# Patient Record
Sex: Male | Born: 1980
Health system: Southern US, Community
[De-identification: ages and names within clinical notes are randomized; demographics above are authoritative.]

## PROBLEM LIST (undated history)

## (undated) DIAGNOSIS — Z789 Other specified health status: Secondary | ICD-10-CM

## (undated) HISTORY — PX: HAND SURGERY: SHX662

## (undated) HISTORY — DX: Other specified health status: Z78.9

---

## 2018-02-19 ENCOUNTER — Telehealth: Payer: Self-pay | Admitting: *Deleted

## 2018-02-19 NOTE — Telephone Encounter (Signed)
Called pt. 2x to sched. and ask questions about becoming new VV pt. 02/19/18 LL

## 2018-04-25 ENCOUNTER — Encounter (HOSPITAL_COMMUNITY): Payer: Self-pay

## 2018-04-25 ENCOUNTER — Ambulatory Visit (INDEPENDENT_AMBULATORY_CARE_PROVIDER_SITE_OTHER): Payer: No Typology Code available for payment source

## 2018-04-25 ENCOUNTER — Ambulatory Visit (HOSPITAL_COMMUNITY)
Admission: EM | Admit: 2018-04-25 | Discharge: 2018-04-25 | Disposition: A | Discharge status: Home / Self Care | Payer: No Typology Code available for payment source | Attending: Family Medicine | Admitting: Family Medicine

## 2018-04-25 DIAGNOSIS — M25471 Effusion, right ankle: Secondary | ICD-10-CM | POA: Diagnosis not present

## 2018-04-25 DIAGNOSIS — M25571 Pain in right ankle and joints of right foot: Secondary | ICD-10-CM

## 2018-04-25 MED ORDER — NAPROXEN 500 MG PO TABS: 500.0000 mg | ORAL_TABLET | Freq: Two times a day (BID) | ORAL | 0 refills | Status: DC

## 2018-04-25 NOTE — Discharge Instructions (Signed)
X-rays did not show fracture or dislocation Continue conservative management of rest, ice, elevation and gentle stretches ASO ankle brace placed Take naproxen as needed for pain relief (may cause abdominal discomfort, ulcers, and GI bleeds avoid taking with other NSAIDs) Follow up with PCP or with orthopedist if symptoms persist Return or go to the ER if you have any new or worsening symptoms (fever, chills, chest pain, abdominal pain, changes in bowel or bladder habits, pain radiating into lower legs, etc...)

## 2018-04-25 NOTE — ED Triage Notes (Signed)
Pt presents with right ankle pain and swelling that has progressively got worse of the pat 2 weeks but is not associated with any injury.

## 2018-04-25 NOTE — ED Notes (Signed)
Patient able to ambulate independently  

## 2018-04-25 NOTE — ED Provider Notes (Signed)
[facility]   [id number] 04/25/18 Arrival Time: 1825  WU:[id number]:JOINT PAIN  SUBJECTIVE: History from: patient. James Ashley is a 37 y.o. male complains of right ankle pain and swelling that began 2 weeks ago.  Denies a precipitating event or specific injury.  Localizes the pain to the outside of ankle.  Describes the pain as intermittent and dull and achy in character.  Has tried OTC medications with temporary relief.  Symptoms are made worse with standing for prolonged periods of time and pointing toes.  Played football in HS and college and "rolled" ankle multiple times.  Denies fever, chills, erythema, ecchymosis, weakness, numbness and tingling.      ROS: As per HPI.  History reviewed. No pertinent past medical history. History reviewed. No pertinent surgical history. No Known Allergies No current facility-administered medications on file prior to encounter.    No current outpatient medications on file prior to encounter.   Social History   Socioeconomic History  . Marital status: Single    Spouse name: Not on file  . Number of children: Not on file  . Years of education: Not on file  . Highest education level: Not on file  Occupational History  . Not on file  Social Needs  . Financial resource strain: Not on file  . Food insecurity:    Worry: Not on file    Inability: Not on file  . Transportation needs:    Medical: Not on file    Non-medical: Not on file  Tobacco Use  . Smoking status: Not on file  Substance and Sexual Activity  . Alcohol use: Not on file  . Drug use: Not on file  . Sexual activity: Not on file  Lifestyle  . Physical activity:    Days per week: Not on file    Minutes per session: Not on file  . Stress: Not on file  Relationships  . Social connections:    Talks on phone: Not on file    Gets together: Not on file    Attends religious service: Not on file    Active member of club or organization: Not on file    Attends meetings of  clubs or organizations: Not on file    Relationship status: Not on file  . Intimate partner violence:    Fear of current or ex partner: Not on file    Emotionally abused: Not on file    Physically abused: Not on file    Forced sexual activity: Not on file  Other Topics Concern  . Not on file  Social History Narrative  . Not on file   History reviewed. No pertinent family history.  OBJECTIVE:  Vitals:   04/25/18 2024  BP: (!) 156/94  Pulse: 80  Resp: 16  Temp: 97.9 F (36.6 C)  TempSrc: Oral  SpO2: 100%    General appearance: AOx3; in no acute distress.  Head: NCAT Lungs: CTA bilaterally Heart: RRR.  Clear S1 and S2 without murmur, gallops, or rubs.  Radial pulses 2+ bilaterally. Musculoskeletal: Right ankle Inspection: Skin warm, dry, clear and intact.  Obvious swelling to the lateral aspect of the ankle Palpation: Diffusely tender over the lateral aspect of the ankle ROM: FROM active and passive Strength: 5/5 knee flexion, 5/5 knee extension, 5/5 dorsiflexion, 5/5 plantar flexion Skin: warm and dry Neurologic: Ambulates without difficulty; Sensation intact about the upper/ lower extremities Psychological: alert and cooperative; normal mood and affect   DIAGNOSTIC STUDIES:  Dg Ankle Complete Right  Result Date: 04/25/2018 CLINICAL DATA:  Right ankle pain and swelling for the past 2 weeks. No known injury. EXAM: RIGHT ANKLE - COMPLETE 3+ VIEW COMPARISON:  None. FINDINGS: Suspected mild diffuse soft tissue swelling about the ankle. No associated fracture or dislocation. Joint spaces are preserved. The ankle mortise is preserved. Potential small ankle joint effusion. Small plantar calcaneal spur. Note is made of a small os peroneus. IMPRESSION: 1. Mild diffuse soft tissue swelling about the ankle and suspected small ankle joint effusion without associated fracture or dislocation. 2. Small plantar calcaneal spur. Electronically Signed   By: Simonne Come M.D.   On: 04/25/2018  19:33   ASSESSMENT & PLAN:  1. Acute right ankle pain   2. Right ankle effusion     Meds ordered this encounter  Medications  . naproxen (NAPROSYN) 500 MG tablet    Sig: Take 1 tablet (500 mg total) by mouth 2 (two) times daily.    Dispense:  30 tablet    Refill:  0    Order Specific Question:   Supervising Provider    Answer:   Eustace Moore [[id number]]   X-rays did not show fracture or dislocation Continue conservative management of rest, ice, elevation and gentle stretches ASO ankle brace placed Take naproxen as needed for pain relief (may cause abdominal discomfort, ulcers, and GI bleeds avoid taking with other NSAIDs) Follow up with PCP or with orthopedist if symptoms persist Return or go to the ER if you have any new or worsening symptoms (fever, chills, chest pain, abdominal pain, changes in bowel or bladder habits, pain radiating into lower legs, etc...)   Reviewed expectations re: course of current medical issues. Questions answered. Outlined signs and symptoms indicating need for more acute intervention. Patient verbalized understanding. After Visit Summary given.    Rennis Harding, PA-C 04/25/18 2114

## 2018-08-31 ENCOUNTER — Encounter: Payer: Self-pay | Admitting: Family Medicine

## 2018-08-31 ENCOUNTER — Other Ambulatory Visit: Payer: Self-pay

## 2018-08-31 ENCOUNTER — Ambulatory Visit (INDEPENDENT_AMBULATORY_CARE_PROVIDER_SITE_OTHER): Payer: No Typology Code available for payment source | Admitting: Family Medicine

## 2018-08-31 DIAGNOSIS — G8929 Other chronic pain: Secondary | ICD-10-CM

## 2018-08-31 DIAGNOSIS — M25571 Pain in right ankle and joints of right foot: Secondary | ICD-10-CM | POA: Diagnosis not present

## 2018-08-31 MED ORDER — MELOXICAM 15 MG PO TABS: 15.0000 mg | ORAL_TABLET | Freq: Every day | ORAL | 0 refills | Status: DC

## 2018-08-31 MED FILL — MELOXICAM 15 MG TABLET: 15 | 30 days supply | Qty: 30 | Fill #0

## 2018-08-31 NOTE — Progress Notes (Addendum)
CC: R ankle pain     New Patient Visit SUBJECTIVE: HPI: James Ashley is an 38 y.o.male who is being seen for establishing care. Due to outbreak, we are interacting via web portal for an electronic face-to-face visit. I verified patient's ID using 2 identifiers.   The patient was previously seen at office that is now out of network.   Hurt ankle 04/2018. No known inj or change in activity. 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 Played college ftball, had ankle issues. Went to [facility], no fx. Placed in brace that did help. Told to f/u with ortho, needs referral per insurance. Still having pain and swelling. No bruising.   No Known Allergies  Past Medical History:  Diagnosis Date  . No known health problems    History reviewed. No pertinent surgical history. Family History  Problem Relation Age of Onset  . Cancer Neg Hx    No Known Allergies  Current Outpatient Medications:  .  meloxicam (MOBIC) 15 MG tablet, Take 1 tablet (15 mg total) by mouth daily., Disp: 30 tablet, Rfl: 0 .  naproxen (NAPROSYN) 500 MG tablet, Take 1 tablet (500 mg total) by mouth 2 (two) times daily., Disp: 30 tablet, Rfl: 0  ROS MSK: +Ankle pain  OBJECTIVE: No conversational dyspnea Age appropriate judgment and insight Nml affect and mood  ASSESSMENT/PLAN: Chronic pain of right ankle - Plan: Ambulatory referral to Orthopedic Surgery, meloxicam (MOBIC) 15 MG tablet  Refer to ortho. Trial Mobic. Ice. Stretches/exercises. Letters given for work.  Patient should return at convenience for CPE. The patient voiced understanding and agreement to the plan.   Jilda Roche [place], DO 08/31/18  4:43 PM

## 2018-08-31 NOTE — Patient Instructions (Signed)
Continue wearing the brace.  Ice/cold pack over area for 10-15 min twice daily.  Heat (pad or rice pillow in microwave) over affected area, 10-15 minutes twice daily.   OK to take Tylenol 1000 mg (2 extra strength tabs) or 975 mg (3 regular strength tabs) every 6 hours as needed.  Ankle Exercises It is normal to feel mild stretching, pulling, tightness, or discomfort as you do these exercises, but you should stop right away if you feel sudden pain or your pain gets worse.  Stretching and range of motion exercises These exercises warm up your muscles and joints and improve the movement and flexibility of your ankle. These exercises also help to relieve pain, numbness, and tingling. Exercise A: Dorsiflexion/Plantar Flexion   1. Sit with your affected knee straight or bent. Do not rest your foot on anything. 2. Flex your affected ankle to tilt the top of your foot toward your shin. 3. Hold this position for 5 seconds. 4. Point your toes downward to tilt the top of your foot away from your shin. 5. Hold this position for 5 seconds. Repeat 2 times. Complete this exercise 3 times per week. Exercise B: Ankle Alphabet   1. Sit with your affected foot supported at your lower leg. ? Do not rest your foot on anything. ? Make sure your foot has room to move freely. 2. Think of your affected foot as a paintbrush, and move your foot to trace each letter of the alphabet in the air. Keep your hip and knee still while you trace. Make the letters as large as you can without increasing any discomfort. 3. Trace every letter from A to Z. Repeat 2 times. Complete this exercise 3 times per week. Strengthening exercises These exercises build strength and endurance in your ankle. Endurance is the ability to use your muscles for a long time, even after they get tired. Exercise D: Dorsiflexors   1. Secure a rubber exercise band or tube to an object, such as a table leg, that will stay still when the band  is pulled. Secure the other end around your affected foot. 2. Sit on the floor, facing the object with your affected leg extended. The band or tube should be slightly tense when your foot is relaxed. 3. Slowly flex your affected ankle and toes to bring your foot toward you. 4. Hold this position for 3 seconds.  5. Slowly return your foot to the starting position, controlling the band as you do that. Do a total of 10 repetitions. Repeat 2 times. Complete this exercise 3 times per week. Exercise E: Plantar Flexors   1. Sit on the floor with your affected leg extended. 2. Loop a rubber exercise band or tube around the ball of your affected foot. The ball of your foot is on the walking surface, right under your toes. The band or tube should be slightly tense when your foot is relaxed. 3. Slowly point your toes downward, pushing them away from you. 4. Hold this position for 3 seconds. 5. Slowly release the tension in the band or tube, controlling smoothly until your foot is back in the starting position. Repeat for a total of 10 repetitions. Repeat 2 times. Complete this exercise 3 times per week. Exercise F: Towel Curls   1. Sit in a chair on a non-carpeted surface, and put your feet on the floor. 2. Place a towel in front of your feet.  3. Keeping your heel on the floor, put your affected foot  on the towel. 4. Pull the towel toward you by grabbing the towel with your toes and curling them under. Keep your heel on the floor. 5. Let your toes relax. 6. Grab the towel again. Keep going until the towel is completely underneath your foot. Repeat for a total of 10 repetitions. Repeat 2 times. Complete this exercise 3 times per week. Exercise G: Heel Raise ( Plantar Flexors, Standing)    1. Stand with your feet shoulder-width apart. 2. Keep your weight spread evenly over the width of your feet while you rise up on your toes. Use a wall or table to steady yourself, but try not to use it for  support. 3. If this exercise is too easy, try these options: ? Shift your weight toward your affected leg until you feel challenged. ? If told by your health care provider, lift your uninjured leg off the floor. 4. Hold this position for 3 seconds. Repeat for a total of 10 repetitions. Repeat 2 times. Complete this exercise 3 times per week. Exercise H: Tandem Walking 1. Stand with one foot directly in front of the other. 2. Slowly raise your back foot up, lifting your heel before your toes, and place it directly in front of your other foot. 3. Continue to walk in this heel-to-toe way for 10 steps or for as long as told by your health care provider. Have a countertop or wall nearby to use if needed to keep your balance, but try not to hold onto anything for support. Repeat 2 times. Complete this exercises 3 times per week. Make sure you discuss any questions you have with your health care provider. Document Released: 03/16/2005 Document Revised: 12/31/2015 Document Reviewed: 01/18/2015 Elsevier Interactive Patient Education  2018 [employer]

## 2018-09-05 ENCOUNTER — Other Ambulatory Visit: Payer: Self-pay | Admitting: Orthopaedic Surgery

## 2018-09-05 DIAGNOSIS — M25571 Pain in right ankle and joints of right foot: Secondary | ICD-10-CM

## 2018-09-10 ENCOUNTER — Telehealth: Payer: Self-pay | Admitting: *Deleted

## 2018-09-10 ENCOUNTER — Other Ambulatory Visit: Payer: Self-pay | Admitting: Orthopaedic Surgery

## 2018-09-10 NOTE — Telephone Encounter (Signed)
Received FMLA/STD paperwork from [employer]; completed as much as possible; forwarded to provider/SLS 04/27

## 2018-09-13 ENCOUNTER — Other Ambulatory Visit: Payer: Self-pay

## 2018-09-13 ENCOUNTER — Ambulatory Visit
Admission: RE | Admit: 2018-09-13 | Discharge: 2018-09-13 | Disposition: A | Discharge status: Home / Self Care | Payer: No Typology Code available for payment source | Source: Ambulatory Visit | Attending: Orthopaedic Surgery | Admitting: Orthopaedic Surgery

## 2018-09-13 DIAGNOSIS — M25571 Pain in right ankle and joints of right foot: Secondary | ICD-10-CM

## 2018-11-05 ENCOUNTER — Other Ambulatory Visit: Payer: Self-pay | Admitting: Family Medicine

## 2018-11-05 ENCOUNTER — Telehealth: Payer: Self-pay | Admitting: Family Medicine

## 2018-11-05 DIAGNOSIS — I83891 Varicose veins of right lower extremities with other complications: Secondary | ICD-10-CM

## 2018-11-05 NOTE — Telephone Encounter (Signed)
Please advise 

## 2018-11-05 NOTE — Telephone Encounter (Signed)
Referral done/patient informed 

## 2018-11-05 NOTE — Telephone Encounter (Signed)
Patient is requesting to see a vascular. Patient is expressing he needs the referral due to a vein in right leg that swells up and is very painful.. 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 Patient would like referral to  [facility]  Patient call back # [phone]

## 2018-11-05 NOTE — Telephone Encounter (Signed)
OK to place referral, dx painful varicose veins. Ty.

## 2018-11-20 ENCOUNTER — Encounter: Payer: No Typology Code available for payment source | Admitting: Family Medicine

## 2018-11-20 DIAGNOSIS — Z0289 Encounter for other administrative examinations: Secondary | ICD-10-CM

## 2019-01-30 ENCOUNTER — Other Ambulatory Visit: Payer: Self-pay

## 2019-01-30 DIAGNOSIS — I83891 Varicose veins of right lower extremities with other complications: Secondary | ICD-10-CM

## 2019-01-31 ENCOUNTER — Other Ambulatory Visit: Payer: Self-pay

## 2019-01-31 ENCOUNTER — Ambulatory Visit (HOSPITAL_COMMUNITY)
Admission: RE | Admit: 2019-01-31 | Discharge: 2019-01-31 | Disposition: A | Discharge status: Home / Self Care | Payer: No Typology Code available for payment source | Source: Ambulatory Visit | Attending: Family | Admitting: Family

## 2019-01-31 ENCOUNTER — Ambulatory Visit (INDEPENDENT_AMBULATORY_CARE_PROVIDER_SITE_OTHER): Payer: No Typology Code available for payment source | Admitting: Vascular Surgery

## 2019-01-31 ENCOUNTER — Encounter: Payer: Self-pay | Admitting: Vascular Surgery

## 2019-01-31 VITALS — BP 143/96 | HR 91 | Temp 96.1°F | Resp 16 | Ht 71.0 in | Wt 301.0 lb

## 2019-01-31 DIAGNOSIS — I83813 Varicose veins of bilateral lower extremities with pain: Secondary | ICD-10-CM

## 2019-01-31 DIAGNOSIS — I83891 Varicose veins of right lower extremities with other complications: Secondary | ICD-10-CM | POA: Diagnosis present

## 2019-01-31 NOTE — Progress Notes (Signed)
REASON FOR CONSULT:    Painful varicose veins of the right lower extremity with edema.  The consult is requested by Dr. Carmelia RollerWendling.  ASSESSMENT & PLAN:   CHRONIC VENOUS INSUFFICIENCY: This patient does have significant deep and superficial venous reflux on the right side.  He has disabling symptoms. He has CEAP C3 venous disease.  We have discussed trying thigh-high compression stockings with a gradient of 20 to 30 mmHg we had him fitted for those today.  I have encouraged him to elevate his legs at the end of the day after work and we have discussed the proper positioning for this.  I have encouraged him to avoid prolonged sitting and standing.  We discussed the importance of exercise specifically walking and water aerobics.  In addition we discussed the importance of weight management given that central obesity especially increases lower extremity venous pressure.  I will see him back in 3 months.  If he fails conservative treatment that I think he would be a reasonable candidate for laser ablation of the right great saphenous vein from the mid thigh to the saphenofemoral junction with 10-20 stab phlebectomies for the varicose veins in his right calf.  Waverly Ferrarihristopher Chabeli Barsamian, MD, FACS Beeper [phone] Office: [phone]   HPI:   James Ashley is a pleasant 38 y.o. male, who was referred with painful varicose veins of the right lower extremity.  I have reviewed the notes from the referring office.  The patient  on 08/31/2018 with chronic pain of the right leg.  He played college football and had issues with his ankles reportedly.  He underwent a work-up but there was no fracture.  He had been in a brace for some time.  He has had some chronic pain in the right leg which affects his gait some.  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  He works 12-hour shifts which oftentimes translate into 13-hour shifts.  He is on his feet for all this time and developed significant aching  pain and heaviness in the right leg which is aggravated with standing and relieved somewhat with elevation at the end of the day.  He tries to wear knee-high compression stockings but they tend to roll down and actually make the swelling worse by the end of the day.  He takes ibuprofen as needed for pain.  He is unaware of any previous history of DVT.  He does have swelling associated with standing especially on the right side.  Past Medical History:  Diagnosis Date  . No known health problems     Family History  Problem Relation Age of Onset  . Cancer Neg Hx     SOCIAL HISTORY: Social History   Socioeconomic History  . Marital status: Single    Spouse name: Not on file  . Number of children: Not on file  . Years of education: Not on file  . Highest education level: Not on file  Occupational History  . Not on file  Social Needs  . Financial resource strain: Not on file  . Food insecurity    Worry: Not on file    Inability: Not on file  . Transportation needs    Medical: Not on file    Non-medical: Not on file  Tobacco Use  . Smoking status: Never Smoker  . Smokeless tobacco: Never Used  Substance and Sexual Activity  . Alcohol use: Not Currently  . Drug use: Not on file  .  Sexual activity: Yes    Partners: Female  Lifestyle  . Physical activity    Days per week: Not on file    Minutes per session: Not on file  . Stress: Not on file  Relationships  . Social Musician on phone: Not on file    Gets together: Not on file    Attends religious service: Not on file    Active member of club or organization: Not on file    Attends meetings of clubs or organizations: Not on file    Relationship status: Not on file  . Intimate partner violence    Fear of current or ex partner: Not on file    Emotionally abused: Not on file    Physically abused: Not on file    Forced sexual activity: Not on file  Other Topics Concern  . Not on file  Social History Narrative   . Not on file    No Known Allergies  Current Outpatient Medications  Medication Sig Dispense Refill  . meloxicam (MOBIC) 15 MG tablet Take 1 tablet (15 mg total) by mouth daily. (Patient not taking: Reported on 01/31/2019) 30 tablet 0  . naproxen (NAPROSYN) 500 MG tablet Take 1 tablet (500 mg total) by mouth 2 (two) times daily. (Patient not taking: Reported on 01/31/2019) 30 tablet 0   No current facility-administered medications for this visit.     REVIEW OF SYSTEMS:  [X]  denotes positive finding, [ ]  denotes negative finding Cardiac  Comments:  Chest pain or chest pressure:    Shortness of breath upon exertion:    Short of breath when lying flat:    Irregular heart rhythm:        Vascular    Pain in calf, thigh, or hip brought on by ambulation: x   Pain in feet at night that wakes you up from your sleep:     Blood clot in your veins:    Leg swelling:  x       Pulmonary    Oxygen at home:    Productive cough:     Wheezing:         Neurologic    Sudden weakness in arms or legs:     Sudden numbness in arms or legs:     Sudden onset of difficulty speaking or slurred speech:    Temporary loss of vision in one eye:     Problems with dizziness:         Gastrointestinal    Blood in stool:     Vomited blood:         Genitourinary    Burning when urinating:     Blood in urine:        Psychiatric    Major depression:         Hematologic    Bleeding problems:    Problems with blood clotting too easily:        Skin    Rashes or ulcers:        Constitutional    Fever or chills:     PHYSICAL EXAM:   Vitals:   01/31/19 1337  BP: (!) 143/96  Pulse: 91  Resp: 16  Temp: (!) 96.1 F (35.6 C)  TempSrc: Temporal  Weight: (!) 301 lb (136.5 kg)  Height: 5\' 11"  (1.803 m)   Body mass index is 41.98 kg/m.  GENERAL: The patient is a well-nourished male, in no acute distress. The vital signs are documented above.  CARDIAC: There is a regular rate and rhythm.   VASCULAR: I do not detect carotid bruits. He has palpable posterior tibial pulses bilaterally. I did examine the great saphenous vein myself on the right with the SonoSite.  There is reflux from the saphenofemoral junction to the distal thigh.  The vein is especially large and the mid to distal third of the thigh.  At this level gives off a large cluster of varicose veins.  I was able to demonstrate reflux at the saphenofemoral junction with the patient standing. He has a cluster of varicose veins in the right leg as documented below. He has mild right lower extremity swelling.  RIGHT LEG:     PULMONARY: There is good air exchange bilaterally without wheezing or rales. ABDOMEN: Soft and non-tender with normal pitched bowel sounds.  MUSCULOSKELETAL: There are no major deformities or cyanosis. NEUROLOGIC: No focal weakness or paresthesias are detected. SKIN: There are no ulcers or rashes noted. PSYCHIATRIC: The patient has a normal affect.  DATA:    VENOUS DUPLEX: I have independently interpreted his venous duplex scan.  On the right side there is no evidence of DVT.  There is deep venous reflux involving the common femoral vein.  There is superficial venous reflux involving the great saphenous vein from the saphenofemoral junction to the distal thigh.  The vein is dilated with diameters ranging from 0.60 0.66 cm.

## 2019-02-18 ENCOUNTER — Other Ambulatory Visit: Payer: Self-pay | Admitting: *Deleted

## 2019-05-02 ENCOUNTER — Ambulatory Visit: Payer: No Typology Code available for payment source | Admitting: Vascular Surgery

## 2019-06-18 ENCOUNTER — Encounter (HOSPITAL_COMMUNITY): Payer: Self-pay

## 2019-06-18 ENCOUNTER — Emergency Department (HOSPITAL_COMMUNITY): Payer: 59

## 2019-06-18 ENCOUNTER — Inpatient Hospital Stay (HOSPITAL_COMMUNITY): Payer: 59

## 2019-06-18 ENCOUNTER — Other Ambulatory Visit: Payer: Self-pay

## 2019-06-18 ENCOUNTER — Inpatient Hospital Stay (HOSPITAL_COMMUNITY)
Admission: EM | Admit: 2019-06-18 | Discharge: 2019-06-21 | DRG: 917 | Disposition: A | Discharge status: Home / Self Care | Payer: 59 | Attending: Internal Medicine | Admitting: Internal Medicine

## 2019-06-18 DIAGNOSIS — J9600 Acute respiratory failure, unspecified whether with hypoxia or hypercapnia: Secondary | ICD-10-CM

## 2019-06-18 DIAGNOSIS — Z6841 Body Mass Index (BMI) 40.0 and over, adult: Secondary | ICD-10-CM | POA: Diagnosis not present

## 2019-06-18 DIAGNOSIS — T424X1A Poisoning by benzodiazepines, accidental (unintentional), initial encounter: Principal | ICD-10-CM | POA: Diagnosis present

## 2019-06-18 DIAGNOSIS — Z79891 Long term (current) use of opiate analgesic: Secondary | ICD-10-CM

## 2019-06-18 DIAGNOSIS — Z791 Long term (current) use of non-steroidal anti-inflammatories (NSAID): Secondary | ICD-10-CM

## 2019-06-18 DIAGNOSIS — G92 Toxic encephalopathy: Secondary | ICD-10-CM | POA: Diagnosis present

## 2019-06-18 DIAGNOSIS — R4182 Altered mental status, unspecified: Secondary | ICD-10-CM | POA: Diagnosis present

## 2019-06-18 DIAGNOSIS — T40604A Poisoning by unspecified narcotics, undetermined, initial encounter: Secondary | ICD-10-CM

## 2019-06-18 DIAGNOSIS — I10 Essential (primary) hypertension: Secondary | ICD-10-CM | POA: Diagnosis present

## 2019-06-18 DIAGNOSIS — Z20822 Contact with and (suspected) exposure to covid-19: Secondary | ICD-10-CM | POA: Diagnosis present

## 2019-06-18 DIAGNOSIS — F329 Major depressive disorder, single episode, unspecified: Secondary | ICD-10-CM | POA: Diagnosis present

## 2019-06-18 DIAGNOSIS — T424X4A Poisoning by benzodiazepines, undetermined, initial encounter: Secondary | ICD-10-CM | POA: Diagnosis not present

## 2019-06-18 DIAGNOSIS — J9601 Acute respiratory failure with hypoxia: Secondary | ICD-10-CM | POA: Diagnosis not present

## 2019-06-18 LAB — BLOOD GAS, ARTERIAL
Acid-Base Excess: 1.8 mmol/L (ref 0.0–2.0)
Bicarbonate: 25.1 mmol/L (ref 20.0–28.0)
FIO2: 100
MECHVT: 600 mL
O2 Saturation: 99.5 %
PEEP: 5 cmH2O
Patient temperature: 98.6
RATE: 16 resp/min
pCO2 arterial: 36.7 mmHg (ref 32.0–48.0)
pH, Arterial: 7.45 (ref 7.350–7.450)
pO2, Arterial: 207 mmHg — ABNORMAL HIGH (ref 83.0–108.0)

## 2019-06-18 LAB — URINALYSIS, ROUTINE W REFLEX MICROSCOPIC
Bilirubin Urine: NEGATIVE
Glucose, UA: NEGATIVE mg/dL
Hgb urine dipstick: NEGATIVE
Ketones, ur: NEGATIVE mg/dL
Leukocytes,Ua: NEGATIVE
Nitrite: NEGATIVE
Protein, ur: NEGATIVE mg/dL
Specific Gravity, Urine: 1.011 (ref 1.005–1.030)
pH: 8 (ref 5.0–8.0)

## 2019-06-18 LAB — CBC WITH DIFFERENTIAL/PLATELET
Abs Immature Granulocytes: 0.04 10*3/uL (ref 0.00–0.07)
Basophils Absolute: 0.1 10*3/uL (ref 0.0–0.1)
Basophils Relative: 0 %
Eosinophils Absolute: 0.5 10*3/uL (ref 0.0–0.5)
Eosinophils Relative: 4 %
HCT: 44.6 % (ref 39.0–52.0)
Hemoglobin: 14.2 g/dL (ref 13.0–17.0)
Immature Granulocytes: 0 %
Lymphocytes Relative: 16 %
Lymphs Abs: 2.3 10*3/uL (ref 0.7–4.0)
MCH: 28.3 pg (ref 26.0–34.0)
MCHC: 31.8 g/dL (ref 30.0–36.0)
MCV: 89 fL (ref 80.0–100.0)
Monocytes Absolute: 1.1 10*3/uL — ABNORMAL HIGH (ref 0.1–1.0)
Monocytes Relative: 8 %
Neutro Abs: 10.1 10*3/uL — ABNORMAL HIGH (ref 1.7–7.7)
Neutrophils Relative %: 72 %
Platelets: 381 10*3/uL (ref 150–400)
RBC: 5.01 MIL/uL (ref 4.22–5.81)
RDW: 13.2 % (ref 11.5–15.5)
WBC: 14.1 10*3/uL — ABNORMAL HIGH (ref 4.0–10.5)
nRBC: 0.1 % (ref 0.0–0.2)

## 2019-06-18 LAB — COMPREHENSIVE METABOLIC PANEL
ALT: 46 U/L — ABNORMAL HIGH (ref 0–44)
AST: 30 U/L (ref 15–41)
Albumin: 4.3 g/dL (ref 3.5–5.0)
Alkaline Phosphatase: 82 U/L (ref 38–126)
Anion gap: 10 (ref 5–15)
BUN: 14 mg/dL (ref 6–20)
CO2: 25 mmol/L (ref 22–32)
Calcium: 9.5 mg/dL (ref 8.9–10.3)
Chloride: 102 mmol/L (ref 98–111)
Creatinine, Ser: 0.84 mg/dL (ref 0.61–1.24)
GFR calc Af Amer: >60 mL/min (ref >60)
GFR calc non Af Amer: >60 mL/min (ref >60)
Glucose, Bld: 108 mg/dL — ABNORMAL HIGH (ref 70–99)
Potassium: 3.7 mmol/L (ref 3.5–5.1)
Sodium: 137 mmol/L (ref 135–145)
Total Bilirubin: 0.7 mg/dL (ref 0.3–1.2)
Total Protein: 8 g/dL (ref 6.5–8.1)

## 2019-06-18 LAB — RAPID URINE DRUG SCREEN, HOSP PERFORMED
Amphetamines: NOT DETECTED
Barbiturates: NOT DETECTED
Benzodiazepines: POSITIVE — AB
Cocaine: NOT DETECTED
Opiates: NOT DETECTED
Tetrahydrocannabinol: NOT DETECTED

## 2019-06-18 LAB — CBG MONITORING, ED: Glucose-Capillary: 104 mg/dL — ABNORMAL HIGH (ref 70–99)

## 2019-06-18 LAB — ACETAMINOPHEN LEVEL: Acetaminophen (Tylenol), Serum: <10 ug/mL — ABNORMAL LOW (ref 10–30)

## 2019-06-18 LAB — SALICYLATE LEVEL: Salicylate Lvl: <7 mg/dL — ABNORMAL LOW (ref 7.0–30.0)

## 2019-06-18 LAB — RESPIRATORY PANEL BY RT PCR (FLU A&B, COVID)
Influenza A by PCR: NEGATIVE
Influenza B by PCR: NEGATIVE
SARS Coronavirus 2 by RT PCR: NEGATIVE

## 2019-06-18 LAB — POC SARS CORONAVIRUS 2 AG -  ED: SARS Coronavirus 2 Ag: NEGATIVE

## 2019-06-18 LAB — TRIGLYCERIDES: Triglycerides: 133 mg/dL (ref <150)

## 2019-06-18 LAB — ETHANOL: Alcohol, Ethyl (B): <10 mg/dL (ref <10)

## 2019-06-18 MED ORDER — PROPOFOL 1000 MG/100ML IV EMUL
0.0000 ug/kg/min | INTRAVENOUS | Status: DC
  Administered 2019-06-18: 5 ug/kg/min via INTRAVENOUS
  Filled 2019-06-18: qty 100

## 2019-06-18 MED ORDER — PROPOFOL 1000 MG/100ML IV EMUL
0.0000 ug/kg/min | INTRAVENOUS | Status: DC
  Administered 2019-06-18 (×2): 50 ug/kg/min via INTRAVENOUS
  Administered 2019-06-19: 40 ug/kg/min via INTRAVENOUS
  Administered 2019-06-19: 50 ug/kg/min via INTRAVENOUS
  Administered 2019-06-19: 40 ug/kg/min via INTRAVENOUS
  Filled 2019-06-18 (×5): qty 100

## 2019-06-18 MED ORDER — FENTANYL CITRATE (PF) 100 MCG/2ML IJ SOLN: 50.0000 ug | INTRAMUSCULAR | Status: DC | PRN

## 2019-06-18 MED ORDER — FAMOTIDINE 40 MG/5ML PO SUSR
20.0000 mg | Freq: Two times a day (BID) | ORAL | Status: DC
  Administered 2019-06-19 (×2): 20 mg
  Filled 2019-06-18 (×4): qty 2.5

## 2019-06-18 MED ORDER — ONDANSETRON HCL 4 MG/2ML IJ SOLN
INTRAMUSCULAR | Status: AC
  Filled 2019-06-18: qty 2

## 2019-06-18 MED ORDER — PROPOFOL 1000 MG/100ML IV EMUL
INTRAVENOUS | Status: AC
  Administered 2019-06-18: 25 ug/kg/min via INTRAVENOUS
  Filled 2019-06-18: qty 100

## 2019-06-18 MED ORDER — SODIUM CHLORIDE 0.9 % IV SOLN: INTRAVENOUS | Status: DC

## 2019-06-18 MED ORDER — ACETAMINOPHEN 325 MG PO TABS: 650.0000 mg | ORAL_TABLET | ORAL | Status: DC | PRN

## 2019-06-18 MED ORDER — ENOXAPARIN SODIUM 30 MG/0.3ML ~~LOC~~ SOLN
30.0000 mg | Freq: Two times a day (BID) | SUBCUTANEOUS | Status: DC
  Administered 2019-06-18 – 2019-06-20 (×4): 30 mg via SUBCUTANEOUS
  Filled 2019-06-18 (×6): qty 0.3

## 2019-06-18 MED ORDER — NALOXONE HCL 2 MG/2ML IJ SOSY
PREFILLED_SYRINGE | INTRAMUSCULAR | Status: AC
  Administered 2019-06-18: 16:00:00 2 mg
  Filled 2019-06-18: qty 2

## 2019-06-18 MED ORDER — NALOXONE HCL 2 MG/2ML IJ SOSY
PREFILLED_SYRINGE | INTRAMUSCULAR | Status: AC
  Administered 2019-06-18: 16:00:00 2 mg via INTRAVENOUS
  Filled 2019-06-18: qty 2

## 2019-06-18 MED ORDER — NALOXONE HCL 4 MG/10ML IJ SOLN
2.0000 mg/h | INTRAVENOUS | Status: DC
  Filled 2019-06-18: qty 10

## 2019-06-18 MED ORDER — ONDANSETRON HCL 4 MG/2ML IJ SOLN: 4.0000 mg | Freq: Four times a day (QID) | INTRAMUSCULAR | Status: DC | PRN

## 2019-06-18 NOTE — ED Notes (Signed)
Pt being preoxygenated by RT and Dr Particia Nearing

## 2019-06-18 NOTE — ED Notes (Signed)
20 etomidate in by Mare Loan RN

## 2019-06-18 NOTE — ED Notes (Signed)
James Ashley [phone] (girlfriend)

## 2019-06-18 NOTE — ED Notes (Signed)
100 succs ordered by Particia Nearing given by Myriam Jacobson

## 2019-06-18 NOTE — ED Notes (Signed)
Patient noted to be moving his arm and fighting against tube and grimacing. Propofol increased

## 2019-06-18 NOTE — ED Notes (Signed)
[facility]) [phone] contact for update.

## 2019-06-18 NOTE — ED Provider Notes (Signed)
[facility]   CSN: [id number] Arrival date & time: 06/18/19  1537     History Chief Complaint  Patient presents with  . Drug Overdose    James Ashley is a 39 y.o. male.  Pt presents to the ED today with a possible fentanyl overdose.  Pt's girlfriend called EMS around 1:45.  Pt was apneic when EMS arrived.  Pt was given 2 mg IM and IN.  He awoke and refused transport.  About 1/2 hr later, he became unresponsive and EMS was called again.  He was breathing, but decreased responsive, so he was given 1 mg of narcan IV.  Pt is unable to give any hx.  Per pt's girlfriend, pt is a Engineer, civil (consulting) at [employer].  He has a hx of substance abuse, but has been clean for a long time.  He has been on methadone and has been doing well.  He lost his mom in October and has had a hard time since then.  He was depressed this morning when he came home from work.  She said she found a "white powder" in his pockets after she found him unresponsive.         Past Medical History:  Diagnosis Date  . No known health problems     Patient Active Problem List   Diagnosis Date Noted  . Chronic pain of right ankle 08/31/2018    Past Surgical History:  Procedure Laterality Date  . HAND SURGERY         Family History  Problem Relation Age of Onset  . Cancer Neg Hx     Social History   Tobacco Use  . Smoking status: Never Smoker  . Smokeless tobacco: Never Used  Substance Use Topics  . Alcohol use: Not Currently  . Drug use: Yes    Home Medications Prior to Admission medications   Medication Sig Start Date End Date Taking? Authorizing Provider  ibuprofen (ADVIL) 200 MG tablet Take 600 mg by mouth every 6 (six) hours as needed for headache or moderate pain.   Yes [provider]  methadone (DOLOPHINE) 1 MG/1ML solution Take 95 mg by mouth daily. Verified through clinic.   Yes [provider]  meloxicam (MOBIC) 15 MG tablet Take 1 tablet  (15 mg total) by mouth daily. Patient not taking: Reported on 01/31/2019 08/31/18   Sharlene Dory, DO  naproxen (NAPROSYN) 500 MG tablet Take 1 tablet (500 mg total) by mouth 2 (two) times daily. Patient not taking: Reported on 01/31/2019 04/25/18   Rennis Harding, PA-C    Allergies    Patient has no known allergies.  Review of Systems   Review of Systems  Unable to perform ROS: Mental status change    Physical Exam Updated Vital Signs BP (!) 165/104   Pulse 73   Temp 98.1 F (36.7 C) (Axillary)   Resp 16   Ht 5\' 11"  (1.803 m)   Wt (!) 154.2 kg   SpO2 100%   BMI 47.42 kg/m   Physical Exam Vitals and nursing note reviewed.  Constitutional:      General: He is in acute distress.     Appearance: He is obese.  HENT:     Head: Normocephalic and atraumatic.     Right Ear: External ear normal.     Left Ear: External ear normal.     Nose: Nose normal.     Mouth/Throat:     Mouth: Mucous membranes are moist.  Pharynx: Oropharynx is clear.  Eyes:     Conjunctiva/sclera: Conjunctivae normal.     Comments: Pupils pinpoint  Cardiovascular:     Rate and Rhythm: Normal rate and regular rhythm.     Pulses: Normal pulses.     Heart sounds: Normal heart sounds.  Pulmonary:     Effort: Tachypnea present.     Comments: Snoring resp Abdominal:     General: Abdomen is flat. Bowel sounds are normal.     Palpations: Abdomen is soft.  Musculoskeletal:        General: Normal range of motion.     Cervical back: Normal range of motion and neck supple.  Skin:    General: Skin is warm.     Capillary Refill: Capillary refill takes less than 2 seconds.  Neurological:     Mental Status: He is unresponsive.  Psychiatric:        Mood and Affect: Mood normal.        Behavior: Behavior normal.        Thought Content: Thought content normal.        Judgment: Judgment normal.     ED Results / Procedures / Treatments   Labs (all labs ordered are listed, but only abnormal  results are displayed) Labs Reviewed  COMPREHENSIVE METABOLIC PANEL - Abnormal; Notable for the following components:      Result Value   Glucose, Bld 108 (*)    ALT 46 (*)    All other components within normal limits  SALICYLATE LEVEL - Abnormal; Notable for the following components:   Salicylate Lvl <7.0 (*)    All other components within normal limits  ACETAMINOPHEN LEVEL - Abnormal; Notable for the following components:   Acetaminophen (Tylenol), Serum <10 (*)    All other components within normal limits  RAPID URINE DRUG SCREEN, HOSP PERFORMED - Abnormal; Notable for the following components:   Benzodiazepines POSITIVE (*)    All other components within normal limits  CBC WITH DIFFERENTIAL/PLATELET - Abnormal; Notable for the following components:   WBC 14.1 (*)    Neutro Abs 10.1 (*)    Monocytes Absolute 1.1 (*)    All other components within normal limits  BLOOD GAS, ARTERIAL - Abnormal; Notable for the following components:   pO2, Arterial 207 (*)    All other components within normal limits  CBG MONITORING, ED - Abnormal; Notable for the following components:   Glucose-Capillary 104 (*)    All other components within normal limits  RESPIRATORY PANEL BY RT PCR (FLU A&B, COVID)  ETHANOL  URINALYSIS, ROUTINE W REFLEX MICROSCOPIC  TRIGLYCERIDES  POC SARS CORONAVIRUS 2 AG -  ED    EKG EKG Interpretation  Date/Time:  Tuesday June 18 2019 16:02:43 EST Ventricular Rate:  83 PR Interval:    QRS Duration: 108 QT Interval:  406 QTC Calculation: 480 R Axis:   6 Text Interpretation: Sinus rhythm Prolonged PR interval RAE, consider biatrial enlargement Low voltage, precordial leads Borderline prolonged QT interval No old tracing to compare Confirmed by Jacalyn Lefevre [phone]) on 06/18/2019 4:54:42 PM   Radiology CT Head Wo Contrast  Result Date: 06/18/2019 CLINICAL DATA:  39 year old male with altered mental status. EXAM: CT HEAD WITHOUT CONTRAST TECHNIQUE: Contiguous  axial images were obtained from the base of the skull through the vertex without intravenous contrast. COMPARISON:  None. FINDINGS: Brain: The ventricles and sulci are appropriate size for patient's age. The gray-white matter discrimination is preserved. There is no acute intracranial hemorrhage. No  mass effect or midline shift. No extra-axial fluid collection. Vascular: No hyperdense vessel or unexpected calcification. Skull: Normal. Negative for fracture or focal lesion. Sinuses/Orbits: There is diffuse mucoperiosteal thickening of paranasal sinuses. No air-fluid level. The mastoid air cells are clear. Other: None IMPRESSION: Unremarkable noncontrast CT of the brain. Electronically Signed   By: Anner Crete M.D.   On: 06/18/2019 18:26   DG Chest Port 1 View  Result Date: 06/18/2019 CLINICAL DATA:  Endotracheal tube placement. EXAM: PORTABLE CHEST 1 VIEW COMPARISON:  None. FINDINGS: An endotracheal tube is seen with its distal tip approximately 1.8 cm from the carina. A nasogastric tube is noted with its distal end seen within the proximal portion of the duodenum. Multiple overlying radiopaque cardiac lead wires are present. Decreased lung volumes are seen on the right, without evidence of acute infiltrate, pleural effusion or pneumothorax. There is elevation of the right hemidiaphragm. The heart size and mediastinal contours are within normal limits. Both lungs are clear. The visualized skeletal structures are unremarkable. IMPRESSION: 1. Endotracheal tube and nasogastric tube positioning, as described above. 2. Decreased lung volumes on the right, without evidence of acute infiltrate. Electronically Signed   By: Virgina Norfolk M.D.   On: 06/18/2019 16:46    Procedures Date/Time: 06/18/2019 5:42 PM Performed by: Isla Pence, MD Pre-anesthesia Checklist: Patient identified, Suction available, Patient being monitored and Timeout performed Oxygen Delivery Method: Ambu bag Preoxygenation:  Pre-oxygenation with 100% oxygen Induction Type: Rapid sequence Ventilation: Mask ventilation without difficulty Laryngoscope Size: Glidescope and 3 Tube size: 7.5 mm Number of attempts: 1 Airway Equipment and Method: Patient positioned with wedge pillow Placement Confirmation: ETT inserted through vocal cords under direct vision,  Positive ETCO2 and Breath sounds checked- equal and bilateral Secured at: 24 cm Tube secured with: ETT holder Dental Injury: Teeth and Oropharynx as per pre-operative assessment  Future Recommendations: Recommend- induction with short-acting agent, and alternative techniques readily available      (including critical care time)  Medications Ordered in ED Medications  0.9 %  sodium chloride infusion ( Intravenous New Bag/Given 06/18/19 1603)  propofol (DIPRIVAN) 1000 MG/100ML infusion (50 mcg/kg/min  154.2 kg Intravenous Rate/Dose Change 06/18/19 1742)  naloxone (NARCAN) 2 MG/2ML injection (2 mg  Given 06/18/19 1555)  ondansetron (ZOFRAN) 4 MG/2ML injection (  Given 06/18/19 1550)  naloxone (NARCAN) 2 MG/2ML injection (2 mg  Given 06/18/19 1550)  naloxone (NARCAN) 2 MG/2ML injection (2 mg Intravenous Given 06/18/19 1601)  ondansetron (ZOFRAN) 4 MG/2ML injection (  Given 06/18/19 1635)    ED Course  I have reviewed the triage vital signs and the nursing notes.  Pertinent labs & imaging results that were available during my care of the patient were reviewed by me and considered in my medical decision making (see chart for details).    MDM Rules/Calculators/A&P                      Pt was not responding to narcan (He was given a total of 6 mg IV).  He was not responding to pain, so he was intubated to protect his airway.  UDS + for benzos which is likely why he did not respond to our narcan.  Covid negative.  Other labs ok.  Pt spoke with Dr. Corinna Lines (e-link) who will send someone for admission.  James Ashley was evaluated in Emergency Department on  06/18/2019 for the symptoms described in the history of present illness. He was evaluated in the context of  the global COVID-19 pandemic, which necessitated consideration that the patient might be at risk for infection with the SARS-CoV-2 virus that causes COVID-19. Institutional protocols and algorithms that pertain to the evaluation of patients at risk for COVID-19 are in a state of rapid change based on information released by regulatory bodies including the CDC and federal and state organizations. These policies and algorithms were followed during the patient's care in the ED.  CRITICAL CARE Performed by: Jacalyn Lefevre   Total critical care time: 80 minutes  Critical care time was exclusive of separately billable procedures and treating other patients.  Critical care was necessary to treat or prevent imminent or life-threatening deterioration.  Critical care was time spent personally by me on the following activities: development of treatment plan with patient and/or surrogate as well as nursing, discussions with consultants, evaluation of patient's response to treatment, examination of patient, obtaining history from patient or surrogate, ordering and performing treatments and interventions, ordering and review of laboratory studies, ordering and review of radiographic studies, pulse oximetry and re-evaluation of patient's condition.   Final Clinical Impression(s) / ED Diagnoses Final diagnoses:  Benzodiazepine overdose of undetermined intent, initial encounter  Opiate overdose, undetermined intent, initial encounter (HCC)  COVID-19 virus not detected    Rx / DC Orders ED Discharge Orders    None       Jacalyn Lefevre, MD 06/18/19 1940

## 2019-06-18 NOTE — H&P (Signed)
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  History is obtained from chart review, speaking to ED attending and from his girlfriend James Ashley who is a [place] in by EMS from home, initially seen after being found unresponsive by girlfriend, given 2 mg IM Narcan and 2 IN, woke up and was A&O x4, refused ER admission and said "I messed up".  Girlfriend found white powder in his possession. He became unresponsive again and she noted a slight facial droop and was dead weight, EMS was called back this time no response to 1 mg IV Narcan, after arrival to the ED received 4 mg IV Narcan without response with snoring respirations commands intubated for airway protection with etomidate and paralytic. Per girlfriend, he has been depressed lately, his mother passed away in 23-Feb-2023.  He is compliant with methadone and takes it from the clinic daily and has never abused this.  When he went to the funeral in [place], he was not able to take methadone and took oxycodone and Xanax which he obtained from a family member, otherwise he does not take any kind of benzodiazepines.  Girlfriend takes Tranxene but keeps this under lock and key and none of her pills are missing   SIGNIFICANT PAST MEDICAL HISTORY   History of  narcotic abuse, on methadone Overdose in his 49s  SIGNIFICANT EVENTS:   STUDIES:   Head CT 2/2 >> neg  CULTURES:    ANTIBIOTICS:    LINES/TUBES:   ETT 2/2 >>  CONSULTANTS:   SUBJECTIVE:    CONSTITUTIONAL: BP (!) 160/97   Pulse 68   Temp 98.1 F (36.7 C) (Axillary)   Resp 15   Ht 5\' 11"  (1.803 m)   Wt (!) 154.2 kg   SpO2 100%   BMI 47.42 kg/m   No intake/output data recorded.     Vent Mode: PRVC FiO2 (%):  [100 %] 100 % Set Rate:  [15 bmp] 15 bmp Vt Set:  [600 mL] 600 mL PEEP:  [5 cmH20] 5 cmH20 Plateau Pressure:  [18 cmH20] 18 cmH20  PHYSICAL EXAM: Gen. obese, in no distress, intubated and sedated ENT - no pallor,icterus, no post nasal drip, oral ETT Neck: No JVD, no thyromegaly, no carotid bruits Lungs: no use of accessory muscles, no dullness to percussion, decreased without rales or rhonchi  Cardiovascular: Rhythm regular, heart sounds  normal, no murmurs or gallops, no peripheral edema Abdomen: soft and non-tender, no hepatosplenomegaly, BS normal. Musculoskeletal: No deformities, no cyanosis or clubbing Neuro:  alert, non focal, no tremors   RESOLVED PROBLEM LIST   ASSESSMENT AND PLAN   Seems to be overdose of benzodiazepines, likely suicidal paste on history from girlfriend but needs to be confirmed He is also methadone user and this was not detected on UDS  Acute respiratory failure -  ventilator settings were reviewed and adjusted Chest x-ray personally reviewed which shows decreased lung volume on the right, ET tube in position. We will repeat chest x-ray in a.m., no evidence of aspiration Hopefully can extubate early Likely has undiagnosed OSA  Acute encephalopathy-use propofol and intermittent fentanyl with goal RASS -1 In a.m., can consider restarting low-dose methadone to avoid withdrawal Head CT negative and was nonfocal on initial exam and also moving around in the ED so doubt we have to further image for stroke  Suicidal  intent-needs to be confirmed from him -Girlfriend confirms that her medication/Tranxene is locked up and none are missing -Suicidal watch once extubated -will need psychiatric assessment for depression  SUMMARY OF TODAY'S PLAN:   Keep intubated and sedated overnight with goal of early extubation in a.m. If does not wake up appropriately after propofol weaned, then may need further imaging with MRI but doubt this would be necessary  Best Practice / Goals of Care / Disposition.   DVT PROPHYLAXIS:lovenox WGN:FAOZHY NUTRITION:npo MOBILITY:BR GOALS OF CARE:NA FAMILY DISCUSSIONS: James Ashley [phone] (girlfriend) DISPOSITION ICU  LABS  Glucose Recent Labs  Lab 06/18/19 1606  GLUCAP 104*    BMET Recent Labs  Lab 06/18/19 1620  NA 137  K 3.7  CL 102  CO2 25  BUN 14  CREATININE 0.84  GLUCOSE 108*    Liver Enzymes Recent Labs  Lab 06/18/19 1620  AST 30  ALT 46*  ALKPHOS 82  BILITOT 0.7  ALBUMIN 4.3    Electrolytes Recent Labs  Lab 06/18/19 1620  CALCIUM 9.5    CBC Recent Labs  Lab 06/18/19 1620  WBC 14.1*  HGB 14.2  HCT 44.6  PLT 381    ABG Recent Labs  Lab 06/18/19 1655  PHART 7.450  PCO2ART 36.7  PO2ART 207*    Coag's No results for input(s): APTT, INR in the last 168 hours.  Sepsis Markers No results for input(s): LATICACIDVEN, PROCALCITON, O2SATVEN in the last 168 hours.  Cardiac Enzymes No results for input(s): TROPONINI, PROBNP in the last 168 hours.  PAST MEDICAL HISTORY :   He  has a past medical history of No known health problems.  PAST SURGICAL HISTORY:  He  has a past surgical history that includes Hand surgery.  No Known Allergies  No current facility-administered medications on file prior to encounter.   Current Outpatient Medications on File Prior to Encounter  Medication Sig  . ibuprofen (ADVIL) 200 MG tablet Take 600 mg by mouth every 6 (six) hours as needed for headache or moderate pain.  . methadone  (DOLOPHINE) 1 MG/1ML solution Take 95 mg by mouth daily. Verified through clinic.  . meloxicam (MOBIC) 15 MG tablet Take 1 tablet (15 mg total) by mouth daily. (Patient not taking: Reported on 01/31/2019)  . naproxen (NAPROSYN) 500 MG tablet Take 1 tablet (500 mg total) by mouth 2 (two) times daily. (Patient not taking: Reported on 01/31/2019)    FAMILY HISTORY:   His family history is negative for Cancer.  SOCIAL HISTORY:  He  reports that he has never smoked. He has never used smokeless tobacco. He reports previous alcohol use. He reports current drug use.  REVIEW OF SYSTEMS:    Unable to obtain since intubated and sedated  Cyril Mourning MD. FCCP. [facility]  If no response to pager , please call [phone]   06/18/2019

## 2019-06-18 NOTE — ED Triage Notes (Signed)
Pt BIBA from home. Pt was seen once earlier by EMS for OD, given 2 IM of Narcan and  2 IN of Narcan. Pt was then A&O x 4. EMS was called back out, received 1 IV Narcan. Upon arrival to ED, pt received 4 of Narcan IV, plus 4 of zofran. Pt still snoring respirations.

## 2019-06-18 NOTE — ED Notes (Signed)
Patient's feet and arm noted to be moving and patient noted to be biting tube again. RN increased propofol

## 2019-06-18 NOTE — ED Notes (Signed)
Pt being bagged by RT currently.

## 2019-06-18 NOTE — ED Notes (Signed)
Patient noted to be grimacing, moving his arms and legs. RN increased propofol

## 2019-06-19 ENCOUNTER — Other Ambulatory Visit: Payer: Self-pay

## 2019-06-19 ENCOUNTER — Encounter: Payer: Self-pay | Admitting: Surgery

## 2019-06-19 ENCOUNTER — Inpatient Hospital Stay (HOSPITAL_COMMUNITY): Payer: 59

## 2019-06-19 DIAGNOSIS — J9601 Acute respiratory failure with hypoxia: Secondary | ICD-10-CM

## 2019-06-19 DIAGNOSIS — T424X4A Poisoning by benzodiazepines, undetermined, initial encounter: Secondary | ICD-10-CM

## 2019-06-19 LAB — CBC
HCT: 38.8 % — ABNORMAL LOW (ref 39.0–52.0)
Hemoglobin: 12.1 g/dL — ABNORMAL LOW (ref 13.0–17.0)
MCH: 27.7 pg (ref 26.0–34.0)
MCHC: 31.2 g/dL (ref 30.0–36.0)
MCV: 88.8 fL (ref 80.0–100.0)
Platelets: 322 10*3/uL (ref 150–400)
RBC: 4.37 MIL/uL (ref 4.22–5.81)
RDW: 13.2 % (ref 11.5–15.5)
WBC: 11.5 10*3/uL — ABNORMAL HIGH (ref 4.0–10.5)
nRBC: 0 % (ref 0.0–0.2)

## 2019-06-19 LAB — BASIC METABOLIC PANEL
Anion gap: 9 (ref 5–15)
BUN: 13 mg/dL (ref 6–20)
CO2: 26 mmol/L (ref 22–32)
Calcium: 8.7 mg/dL — ABNORMAL LOW (ref 8.9–10.3)
Chloride: 103 mmol/L (ref 98–111)
Creatinine, Ser: 0.82 mg/dL (ref 0.61–1.24)
GFR calc Af Amer: >60 mL/min (ref >60)
GFR calc non Af Amer: >60 mL/min (ref >60)
Glucose, Bld: 115 mg/dL — ABNORMAL HIGH (ref 70–99)
Potassium: 4 mmol/L (ref 3.5–5.1)
Sodium: 138 mmol/L (ref 135–145)

## 2019-06-19 LAB — PHOSPHORUS: Phosphorus: 4.4 mg/dL (ref 2.5–4.6)

## 2019-06-19 LAB — MAGNESIUM: Magnesium: 2 mg/dL (ref 1.7–2.4)

## 2019-06-19 LAB — TRIGLYCERIDES: Triglycerides: 122 mg/dL (ref <150)

## 2019-06-19 MED ORDER — CHLORHEXIDINE GLUCONATE CLOTH 2 % EX PADS
6.0000 | MEDICATED_PAD | Freq: Every day | CUTANEOUS | Status: DC
  Administered 2019-06-19 – 2019-06-20 (×2): 6 via TOPICAL

## 2019-06-19 MED ORDER — CHLORHEXIDINE GLUCONATE 0.12% ORAL RINSE (MEDLINE KIT)
15.0000 mL | Freq: Two times a day (BID) | OROMUCOSAL | Status: DC
  Administered 2019-06-19: 15 mL via OROMUCOSAL

## 2019-06-19 MED ORDER — ORAL CARE MOUTH RINSE
15.0000 mL | Freq: Two times a day (BID) | OROMUCOSAL | Status: DC
  Administered 2019-06-19 – 2019-06-20 (×2): 15 mL via OROMUCOSAL

## 2019-06-19 MED ORDER — DEXMEDETOMIDINE HCL IN NACL 200 MCG/50ML IV SOLN
0.2000 ug/kg/h | INTRAVENOUS | Status: DC
  Administered 2019-06-19: 0.2 ug/kg/h via INTRAVENOUS
  Filled 2019-06-19: qty 50

## 2019-06-19 MED ORDER — CLONIDINE HCL 0.1 MG PO TABS
0.1000 mg | ORAL_TABLET | Freq: Three times a day (TID) | ORAL | Status: DC | PRN
  Administered 2019-06-20: 0.1 mg via ORAL
  Filled 2019-06-19: qty 1

## 2019-06-19 MED ORDER — ORAL CARE MOUTH RINSE
15.0000 mL | OROMUCOSAL | Status: DC
  Administered 2019-06-19 (×4): 15 mL via OROMUCOSAL

## 2019-06-19 MED ORDER — AMLODIPINE BESYLATE 5 MG PO TABS
5.0000 mg | ORAL_TABLET | Freq: Once | ORAL | Status: AC
  Administered 2019-06-19: 5 mg via ORAL
  Filled 2019-06-19: qty 1

## 2019-06-19 MED ORDER — LIP MEDEX EX OINT
TOPICAL_OINTMENT | CUTANEOUS | Status: AC
  Filled 2019-06-19: qty 7

## 2019-06-19 NOTE — Progress Notes (Signed)
Patient A&Ox1, sleeping a lot today. Psych evaluation would be better for 06/20/19.

## 2019-06-19 NOTE — Consult Note (Signed)
Patient extubated on this date.  Patient continues to exhibit symptoms of sedation.  Patient unable to actively participate and psychiatry consult today.  Will consult tomorrow.  MD made aware.

## 2019-06-19 NOTE — Progress Notes (Signed)
eLink Physician-Brief Progress Note Patient Name: James Ashley DOB: January 02, 1981 MRN: [id number]   Date of Service  06/19/2019  HPI/Events of Note  Hypertension - BP = 174/98.   eICU Interventions  Will order: Catapres 0.1 mg PO Q 8 hours PRN SBP > 170 or DBP > 100.     Intervention Category Major Interventions: Hypertension - evaluation and management  Marymargaret Kirker Eugene 06/19/2019, 11:44 PM

## 2019-06-19 NOTE — Progress Notes (Signed)
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  History is obtained from chart review, speaking to ED attending and from his girlfriend Leslie Dales who is a PACU nurse Brought in by EMS from home, initially seen after being found unresponsive by girlfriend, given 2 mg IM Narcan and 2 IN, woke up and was A&O x4, refused ER admission and said "I messed up".  Girlfriend found white powder in his possession. He became unresponsive again and she noted a slight facial droop and was dead weight, EMS was called back this time no response to 1 mg IV Narcan, after arrival to the ED received 4 mg IV Narcan without response with snoring respirations commands intubated for airway protection with etomidate and paralytic. Per girlfriend, he has been depressed lately, his mother passed away in 02/09/23.  He is compliant with methadone and takes it from the clinic daily and has never abused this.  When he went to the funeral in [place], he was not able to take methadone and took oxycodone and Xanax which he obtained from a family member, otherwise he does not take any kind of benzodiazepines.  Girlfriend takes Tranxene but keeps this under lock and key and none of her pills are missing   SIGNIFICANT PAST MEDICAL HISTORY   History of  narcotic abuse, on methadone Overdose in his 13s  SIGNIFICANT EVENTS:  2/2 Intubated for airway protection in setting of substance overdose. Admitted to ICU  2/3 Extubated STUDIES:   Head CT 2/2 >> neg  CULTURES:    ANTIBIOTICS:    LINES/TUBES:   ETT 2/2- 2/3  CONSULTANTS:   Psych SUBJECTIVE:    CONSTITUTIONAL: BP (!) 140/97   Pulse 86   Temp 99 F (37.2 C) (Bladder)   Resp 14   Ht 5\' 11"  (1.803 m)   Wt (!) 143.9 kg   SpO2 94%   BMI 44.25 kg/m   I/O last 3 completed shifts: In: 2301.6 [I.V.:2301.6] Out: 750 [Urine:450; Emesis/NG output:300]     Vent Mode: PSV;CPAP FiO2 (%):  [30 %-100 %] 30 % Set Rate:  [15 bmp-16 bmp] 16 bmp Vt Set:  [600 mL] 600 mL PEEP:  [5 cmH20] 5 cmH20 Pressure Support:  [5 cmH20] 5 cmH20 Plateau Pressure:  [17 cmH20-19 cmH20] 17 cmH20  PHYSICAL EXAM: Gen: WNWD obese adult M, intubated NAD.  HEENT: NCAT pink mmm anicteric sclera, trachea midline, ETT OGT secure  Lungs: Symmetrical chest expansion. CTA. No accessory use on PSV/CPAP. Mechanical vent sounds are audible Cardiovascular: RRR s1s2 no rgm Cap refill < 3 seconds  Abdomen: Soft round ndnt + bowel sounds x4  Musculoskeletal: Symmetrical bulk and tone, no cyanosis or clubbing  Neuro:  Examined off sedation. Drowsy, awakens and follows commands PERRL 52mm    RESOLVED PROBLEM LIST   ASSESSMENT AND PLAN  Acute respiratory failure in setting of decreased LOC, inability to protect airway, related to medication overdose P -WUA SBT -Extubate -IS -Supplemental O2 as needed for goal > 92%   Acute encephalopathy, suspect toxic in setting of medication overdose  P -Delirium precautions -Dc all sedation when extubated  -trend LFTs, will order CMP for 2/4   Depression Possible SI  -Girlfriend confirmed that her medication/Tranxene is locked up and none are missing   -suspected BZD overdose  P -IVc, 1:1 -psych consult placed  -need to discuss with patient when  extubated for further clarification on what substance was ingested as well as intent   Hx opioid abuse -on methadone at home P -delay restarting methadone in setting of encephalopathy  -supportive care if sx of opioid withdrawals begin, and will continue to evaluate when safe to resume home meds.    SUMMARY OF TODAY'S PLAN:   Extubate when more awake  Psych consult  Best Practice / Goals of Care / Disposition.   DVT PROPHYLAXIS:lovenox NFA:OZHYQM NUTRITION:npo MOBILITY:BR GOALS OF CARE:NA FAMILY DISCUSSIONS: Dorise Hiss [phone] (girlfriend) updated at bedside  DISPOSITION ICU  LABS  Glucose Recent Labs  Lab 06/18/19 1606  GLUCAP 104*    BMET Recent Labs  Lab 06/18/19 1620 06/19/19 0258  NA 137 138  K 3.7 4.0  CL 102 103  CO2 25 26  BUN 14 13  CREATININE 0.84 0.82  GLUCOSE 108* 115*    Liver Enzymes Recent Labs  Lab 06/18/19 1620  AST 30  ALT 46*  ALKPHOS 82  BILITOT 0.7  ALBUMIN 4.3    Electrolytes Recent Labs  Lab 06/18/19 1620 06/19/19 0258  CALCIUM 9.5 8.7*  MG  --  2.0  PHOS  --  4.4    CBC Recent Labs  Lab 06/18/19 1620 06/19/19 0258  WBC 14.1* 11.5*  HGB 14.2 12.1*  HCT 44.6 38.8*  PLT 381 322    ABG Recent Labs  Lab 06/18/19 1655  PHART 7.450  PCO2ART 36.7  PO2ART 207*    Coag's No results for input(s): APTT, INR in the last 168 hours.  Sepsis Markers No results for input(s): LATICACIDVEN, PROCALCITON, O2SATVEN in the last 168 hours.  Cardiac Enzymes No results for input(s): TROPONINI, PROBNP in the last 168 hours.   CRITICAL CARE Performed by: Cristal Generous   Total critical care time: 40 minutes  Critical care time was exclusive of separately billable procedures and treating other patients. Critical care was necessary to treat or prevent imminent or life-threatening deterioration.  Critical care was time spent personally by me on the following activities: development of treatment plan with patient  and/or surrogate as well as nursing, discussions with consultants, evaluation of patient's response to treatment, examination of patient, obtaining history from patient or surrogate, ordering and performing treatments and interventions, ordering and review of laboratory studies, ordering and review of radiographic studies, pulse oximetry and re-evaluation of patient's condition.  Eliseo Gum MSN, AGACNP-BC [place] [phone] If no answer, [phone] 06/19/2019, 11:10 AM

## 2019-06-19 NOTE — Progress Notes (Signed)
Continued hypertension despite earlier PO med.  Patient oriented to self and location, calm, cooperative.  Elink notified.

## 2019-06-19 NOTE — Plan of Care (Signed)
Patient admitted to room 1226, placed on telemetry.  OG to LIWS.  VSS.

## 2019-06-19 NOTE — Procedures (Signed)
Extubation Procedure Note  Patient Details:   Name: James Ashley DOB: Dec 25, 1980 MRN: [id number]   Airway Documentation:    Vent end date: 06/19/19 Vent end time: 1100   Evaluation  O2 sats: 98 Complications: none Patient tolerated procedure well. Bilateral Breath Sounds: Clear   Pt able to speak  Revonda Humphrey 06/19/2019, 11:07 AM

## 2019-06-19 NOTE — Progress Notes (Signed)
eLink Physician-Brief Progress Note Patient Name: James Ashley DOB: April 17, 1981 MRN: [id number]   Date of Service  06/19/2019  HPI/Events of Note  ETT at level of carina.   eICU Interventions  Will order: 1. Pull ETT back 3 cm, re-secure ETT and repeat portable CXR.     Intervention Category Major Interventions: Other:  Lenell Antu 06/19/2019, 6:35 AM

## 2019-06-19 NOTE — Progress Notes (Signed)
NUTRITION NOTE  Patient screened for new vent, did not screen for any other reason. Able to talk with RN who reports patient to be extubated shortly. Weight today is 317 lb and weight on 01/31/19 was 300 lb. No nutrition needs.     Trenton Gammon, MS, RD, LDN, [facility] Inpatient Clinical Dietitian Pager # [phone] After hours/weekend pager # [phone]

## 2019-06-19 NOTE — TOC Initial Note (Signed)
Transition of Care [facility]) - Initial/Assessment Note    Patient Details  Name: James Ashley MRN: [id number] Date of Birth: 06/15/1980  Transition of Care [facility]) CM/SW Contact:    Lanier Clam, RN Phone Number: 06/19/2019, 12:41 PM  Clinical Narrative:Received amditted for OD benzodiazepine.CM referral for IVC-spoke to NP/Nsg/& s/o Laura-determined since patient Ax1 currently, just extubated, not threatening anyone,or a danger to self or others-will await Psych eval, d/c IVC until psych has eval.Laura d/c plan-inpt rehab.  CM continue to monitor.                 Expected Discharge Plan: Home/Self Care Barriers to Discharge: Continued Medical Work up   Patient Goals and CMS Choice        Expected Discharge Plan and Services Expected Discharge Plan: Home/Self Care   Discharge Planning Services: CM Consult                                          Prior Living Arrangements/Services     Patient language and need for interpreter reviewed:: Yes Do you feel safe going back to the place where you live?: Yes      Need for Family Participation in Patient Care: No (Comment) Care giver support system in place?: Yes (comment)   Criminal Activity/Legal Involvement Pertinent to Current Situation/Hospitalization: No - Comment as needed  Activities of Daily Living Home Assistive Devices/Equipment: None ADL Screening (condition at time of admission) Patient's cognitive ability adequate to safely complete daily activities?: No Is the patient deaf or have difficulty hearing?: No Does the patient have difficulty seeing, even when wearing glasses/contacts?: No Does the patient have difficulty concentrating, remembering, or making decisions?: No Patient able to express need for assistance with ADLs?: Yes Does the patient have difficulty dressing or bathing?: No Independently performs ADLs?: Yes (appropriate for developmental age) Does the patient have difficulty walking or  climbing stairs?: No Weakness of Legs: None Weakness of Arms/Hands: None  Permission Sought/Granted Permission sought to share information with : Case Manager Permission granted to share information with : Yes, Verbal Permission Granted  Share Information with NAME: Case Manager  Permission granted to share info w AGENCY: Inpt rehab  or otpt rehab  Permission granted to share info w Relationship: Leslie Dales [phone]     Emotional Assessment Appearance:: Appears stated age Attitude/Demeanor/Rapport: Unable to Assess Affect (typically observed): Unable to Assess Orientation: : Oriented to Self Alcohol / Substance Use: Alcohol Use, Illicit Drugs Psych Involvement: Yes (comment)  Admission diagnosis:  Benzodiazepine overdose of undetermined intent, initial encounter [T42.4X4A] Overdose of benzodiazepine, undetermined intent, initial encounter [T42.4X4A] Opiate overdose, undetermined intent, initial encounter (HCC) [T40.604A] COVID-19 virus not detected [Z03.818] Patient Active Problem List   Diagnosis Date Noted  . Overdose of benzodiazepine, undetermined intent, initial encounter 06/18/2019  . Chronic pain of right ankle 08/31/2018   PCP:  Sharlene Dory, DO Pharmacy:   [id number] [place], [place] - [address] AT [facility] [address] [place] [address] Phone: [phone] Fax: [phone]  [facility] - [place], [place] - [address] [place] [address] Phone: [phone] Fax: [phone]     Social Determinants of Health (SDOH) Interventions    Readmission Risk Interventions No flowsheet data found.

## 2019-06-20 LAB — COMPREHENSIVE METABOLIC PANEL
ALT: 29 U/L (ref 0–44)
AST: 15 U/L (ref 15–41)
Albumin: 3.3 g/dL — ABNORMAL LOW (ref 3.5–5.0)
Alkaline Phosphatase: 62 U/L (ref 38–126)
Anion gap: 8 (ref 5–15)
BUN: 13 mg/dL (ref 6–20)
CO2: 25 mmol/L (ref 22–32)
Calcium: 8.2 mg/dL — ABNORMAL LOW (ref 8.9–10.3)
Chloride: 106 mmol/L (ref 98–111)
Creatinine, Ser: 0.81 mg/dL (ref 0.61–1.24)
GFR calc Af Amer: >60 mL/min (ref >60)
GFR calc non Af Amer: >60 mL/min (ref >60)
Glucose, Bld: 80 mg/dL (ref 70–99)
Potassium: 3.6 mmol/L (ref 3.5–5.1)
Sodium: 139 mmol/L (ref 135–145)
Total Bilirubin: 1 mg/dL (ref 0.3–1.2)
Total Protein: 6.2 g/dL — ABNORMAL LOW (ref 6.5–8.1)

## 2019-06-20 LAB — MRSA PCR SCREENING: MRSA by PCR: NEGATIVE

## 2019-06-20 LAB — TRIGLYCERIDES: Triglycerides: 133 mg/dL (ref <150)

## 2019-06-20 MED ORDER — AMLODIPINE BESYLATE 5 MG PO TABS
5.0000 mg | ORAL_TABLET | Freq: Every day | ORAL | Status: DC
  Administered 2019-06-20 – 2019-06-21 (×2): 5 mg via ORAL
  Filled 2019-06-20 (×2): qty 1

## 2019-06-20 NOTE — Progress Notes (Signed)
According to Dr Lucianne Muss, psychiatric medical provider, the patient should be transferred to [facility] when medically cleared.  Phone number to coordinate care is [phone].  Thank you!  Nanine Means, PMHNP

## 2019-06-20 NOTE — Consult Note (Signed)
Telepsych Consultation   Reason for Consult:  Overdose of Benzodiazpine Referring Physician:  EPD Location of Patient: 1605-01 Location of Provider: [facility]  Patient Identification: James Ashley MRN:  [id number] Principal Diagnosis: <principal problem not specified> Diagnosis:  Active Problems:   Overdose of benzodiazepine, undetermined intent, initial encounter   Total Time spent with patient: 15 minutes  Subjective:   James Ashley is a 39 y.o. male was seen via teleassessment.  He is awake alert and oriented x3.  Denying suicidal or homicidal ideations.  Denies auditory or visual hallucinations.  Patient reports feeling depressed after hearing a song that reminded him of his mother who passed away in Feb 28, 2023.  States he took 3 Xanax bars.  Chart review patient has a substance abuse history.  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  Patient is requesting outpatient resources for therapy, psychiatry and substance abuse. NP spoke to girlfriend Cherylann Ratel who is seen at bedside.  She denies history of self injures behaviors or safety concerns.  Denies guns in the home.  Reports patient does need long-term treatment for his substance abuse use.  Case staffed with attending psychiatrist medical Director MD Dwyane Dee recommends patient follow-up with outpatient resources.  Support, encouragement  Reassurance and was provided  HPI: Per admission assessment note  Past Psychiatric History:  Risk to Self:   Risk to Others:   Prior Inpatient Therapy:   Prior Outpatient Therapy:    Past Medical History:  Past Medical History:  Diagnosis Date  . No known health problems     Past Surgical History:  Procedure Laterality Date  . HAND SURGERY     Family History:  Family History  Problem Relation Age of Onset  . Cancer Neg Hx    Family Psychiatric  History:  Social History:  Social History   Substance and Sexual  Activity  Alcohol Use Not Currently     Social History   Substance and Sexual Activity  Drug Use Yes    Social History   Socioeconomic History  . Marital status: Single    Spouse name: Not on file  . Number of children: Not on file  . Years of education: Not on file  . Highest education level: Not on file  Occupational History  . Not on file  Tobacco Use  . Smoking status: Never Smoker  . Smokeless tobacco: Never Used  Substance and Sexual Activity  . Alcohol use: Not Currently  . Drug use: Yes  . Sexual activity: Yes    Partners: Female  Other Topics Concern  . Not on file  Social History Narrative  . Not on file   Social Determinants of Health   Financial Resource Strain:   . Difficulty of Paying Living Expenses: Not on file  Food Insecurity:   . Worried About Charity fundraiser in the Last Year: Not on file  . Ran Out of Food in the Last Year: Not on file  Transportation Needs:   . Lack of Transportation (Medical): Not on file  . Lack of Transportation (Non-Medical): Not on file  Physical Activity:   . Days of Exercise per Week: Not on file  . Minutes of Exercise per Session: Not on file  Stress:   . Feeling of Stress : Not on file  Social Connections:   . Frequency of Communication with Friends and Family: Not on file  . Frequency of Social Gatherings with Friends and Family:  Not on file  . Attends Religious Services: Not on file  . Active Member of Clubs or Organizations: Not on file  . Attends Archivist Meetings: Not on file  . Marital Status: Not on file   Additional Social History:    Allergies:  No Known Allergies  Labs:  Results for orders placed or performed during the hospital encounter of 06/18/19 (from the past 48 hour(s))  Urine rapid drug screen (hosp performed)     Status: Abnormal   Collection Time: 06/18/19  4:05 PM  Result Value Ref Range   Opiates NONE DETECTED NONE DETECTED   Cocaine NONE DETECTED NONE DETECTED    Benzodiazepines POSITIVE (A) NONE DETECTED   Amphetamines NONE DETECTED NONE DETECTED   Tetrahydrocannabinol NONE DETECTED NONE DETECTED   Barbiturates NONE DETECTED NONE DETECTED    Comment: (NOTE) DRUG SCREEN FOR MEDICAL PURPOSES ONLY.  IF CONFIRMATION IS NEEDED FOR ANY PURPOSE, NOTIFY LAB WITHIN 5 DAYS. LOWEST DETECTABLE LIMITS FOR URINE DRUG SCREEN Drug Class                     Cutoff (ng/mL) Amphetamine and metabolites    1000 Barbiturate and metabolites    200 Benzodiazepine                 604 Tricyclics and metabolites     300 Opiates and metabolites        300 Cocaine and metabolites        300 THC                            50 Performed at [facility], [place] [address], [place], [place] [address]   Urinalysis, Routine w reflex microscopic     Status: None   Collection Time: 06/18/19  4:05 PM  Result Value Ref Range   Color, Urine YELLOW YELLOW   APPearance CLEAR CLEAR   Specific Gravity, Urine 1.011 1.005 - 1.030   pH 8.0 5.0 - 8.0   Glucose, UA NEGATIVE NEGATIVE mg/dL   Hgb urine dipstick NEGATIVE NEGATIVE   Bilirubin Urine NEGATIVE NEGATIVE   Ketones, ur NEGATIVE NEGATIVE mg/dL   Protein, ur NEGATIVE NEGATIVE mg/dL   Nitrite NEGATIVE NEGATIVE   Leukocytes,Ua NEGATIVE NEGATIVE    Comment: Performed at [facility], [place] [address], [place],  [address]  CBG monitoring, ED     Status: Abnormal   Collection Time: 06/18/19  4:06 PM  Result Value Ref Range   Glucose-Capillary 104 (H) 70 - 99 mg/dL  Comprehensive metabolic panel     Status: Abnormal   Collection Time: 06/18/19  4:20 PM  Result Value Ref Range   Sodium 137 135 - 145 mmol/L   Potassium 3.7 3.5 - 5.1 mmol/L   Chloride 102 98 - 111 mmol/L   CO2 25 22 - 32 mmol/L   Glucose, Bld 108 (H) 70 - 99 mg/dL   BUN 14 6 - 20 mg/dL   Creatinine, Ser 0.84 0.61 - 1.24 mg/dL   Calcium 9.5 8.9 - 10.3 mg/dL   Total Protein 8.0 6.5 - 8.1 g/dL   Albumin 4.3 3.5 - 5.0 g/dL    AST 30 15 - 41 U/L   ALT 46 (H) 0 - 44 U/L   Alkaline Phosphatase 82 38 - 126 U/L   Total Bilirubin 0.7 0.3 - 1.2 mg/dL   GFR calc non Af Amer >60 >60 mL/min  GFR calc Af Amer >60 >60 mL/min   Anion gap 10 5 - 15    Comment: Performed at [facility], [place] [address], [place], [place] [address]  Salicylate level     Status: Abnormal   Collection Time: 06/18/19  4:20 PM  Result Value Ref Range   Salicylate Lvl <9.5 (L) 7.0 - 30.0 mg/dL    Comment: Performed at [facility], [place] [address] [place], [place] [address]  Acetaminophen level     Status: Abnormal   Collection Time: 06/18/19  4:20 PM  Result Value Ref Range   Acetaminophen (Tylenol), Serum <10 (L) 10 - 30 ug/mL    Comment: (NOTE) Therapeutic concentrations vary significantly. A range of 10-30 ug/mL  may be an effective concentration for many patients. However, some  are best treated at concentrations outside of this range. Acetaminophen concentrations >150 ug/mL at 4 hours after ingestion  and >50 ug/mL at 12 hours after ingestion are often associated with  toxic reactions. Performed at [facility], [place] [address] [place], [place] [address]   Ethanol     Status: None   Collection Time: 06/18/19  4:20 PM  Result Value Ref Range   Alcohol, Ethyl (B) <10 <10 mg/dL    Comment: (NOTE) Lowest detectable limit for serum alcohol is 10 mg/dL. For medical purposes only. Performed at [facility], [place] [address], [place], [place] [address]   CBC WITH DIFFERENTIAL     Status: Abnormal   Collection Time: 06/18/19  4:20 PM  Result Value Ref Range   WBC 14.1 (H) 4.0 - 10.5 K/uL   RBC 5.01 4.22 - 5.81 MIL/uL   Hemoglobin 14.2 13.0 - 17.0 g/dL   HCT 44.6 39.0 - 52.0 %   MCV 89.0 80.0 - 100.0 fL   MCH 28.3 26.0 - 34.0 pg   MCHC 31.8 30.0 - 36.0 g/dL   RDW 13.2 11.5 - 15.5 %   Platelets 381 150 - 400 K/uL   nRBC 0.1 0.0 - 0.2 %   Neutrophils  Relative % 72 %   Neutro Abs 10.1 (H) 1.7 - 7.7 K/uL   Lymphocytes Relative 16 %   Lymphs Abs 2.3 0.7 - 4.0 K/uL   Monocytes Relative 8 %   Monocytes Absolute 1.1 (H) 0.1 - 1.0 K/uL   Eosinophils Relative 4 %   Eosinophils Absolute 0.5 0.0 - 0.5 K/uL   Basophils Relative 0 %   Basophils Absolute 0.1 0.0 - 0.1 K/uL   Immature Granulocytes 0 %   Abs Immature Granulocytes 0.04 0.00 - 0.07 K/uL    Comment: Performed at [facility], [place] [address], [place], [place] [address]  Triglycerides     Status: None   Collection Time: 06/18/19  4:20 PM  Result Value Ref Range   Triglycerides 133 <150 mg/dL    Comment: Performed at [facility], [place] [address] [place], [place] [address]  Respiratory Panel by RT PCR (Flu A&B, Covid) - Nasopharyngeal Swab     Status: None   Collection Time: 06/18/19  4:48 PM   Specimen: Nasopharyngeal Swab  Result Value Ref Range   SARS Coronavirus 2 by RT PCR NEGATIVE NEGATIVE    Comment: (NOTE) SARS-CoV-2 target nucleic acids are NOT DETECTED. The SARS-CoV-2 RNA is generally detectable in upper respiratoy specimens during the acute phase of infection. The lowest concentration of SARS-CoV-2 viral copies this assay can detect is 131 copies/mL. A negative result  does not preclude SARS-Cov-2 infection and should not be used as the sole basis for treatment or other patient management decisions. A negative result may occur with  improper specimen collection/handling, submission of specimen other than nasopharyngeal swab, presence of viral mutation(s) within the areas targeted by this assay, and inadequate number of viral copies (<131 copies/mL). A negative result must be combined with clinical observations, patient history, and epidemiological information. The expected result is Negative. Fact Sheet for Patients:  PinkCheek.be Fact Sheet for Healthcare Providers:   GravelBags.it This test is not yet ap proved or cleared by the [place] FDA and  has been authorized for detection and/or diagnosis of SARS-CoV-2 by FDA under an Emergency Use Authorization (EUA). This EUA will remain  in effect (meaning this test can be used) for the duration of the COVID-19 declaration under Section 564(b)(1) of the Act, 21 U.S.C. section 360bbb-3(b)(1), unless the authorization is terminated or revoked sooner.    Influenza A by PCR NEGATIVE NEGATIVE   Influenza B by PCR NEGATIVE NEGATIVE    Comment: (NOTE) The Xpert Xpress SARS-CoV-2/FLU/RSV assay is intended as an aid in  the diagnosis of influenza from Nasopharyngeal swab specimens and  should not be used as a sole basis for treatment. Nasal washings and  aspirates are unacceptable for Xpert Xpress SARS-CoV-2/FLU/RSV  testing. Fact Sheet for Patients: PinkCheek.be Fact Sheet for Healthcare Providers: GravelBags.it This test is not yet approved or cleared by the [place] FDA and  has been authorized for detection and/or diagnosis of SARS-CoV-2 by  FDA under an Emergency Use Authorization (EUA). This EUA will remain  in effect (meaning this test can be used) for the duration of the  Covid-19 declaration under Section 564(b)(1) of the Act, 21  U.S.C. section 360bbb-3(b)(1), unless the authorization is  terminated or revoked. Performed at [facility], [place] [address], [place], [place] [address]   Blood gas, arterial (at [facility])     Status: Abnormal   Collection Time: 06/18/19  4:55 PM  Result Value Ref Range   FIO2 100.00    Delivery systems VENTILATOR    Mode PRESSURE REGULATED VOLUME CONTROL    VT 600 mL   LHR 16 resp/min   Peep/cpap 5.0 cm H20   pH, Arterial 7.450 7.350 - 7.450   pCO2 arterial 36.7 32.0 - 48.0 mmHg   pO2, Arterial 207 (H) 83.0 - 108.0 mmHg   Bicarbonate 25.1 20.0 - 28.0  mmol/L   Acid-Base Excess 1.8 0.0 - 2.0 mmol/L   O2 Saturation 99.5 %   Patient temperature 98.6    Collection site RIGHT RADIAL    Allens test (pass/fail) PASS PASS    Comment: Performed at [facility], [place] [address], [place], [place] [address]  POC SARS Coronavirus 2 Ag-ED - Nasal Swab (BD Veritor Kit)     Status: None   Collection Time: 06/18/19  5:25 PM  Result Value Ref Range   SARS Coronavirus 2 Ag NEGATIVE NEGATIVE    Comment: (NOTE) SARS-CoV-2 antigen NOT DETECTED.  Negative results are presumptive.  Negative results do not preclude SARS-CoV-2 infection and should not be used as the sole basis for treatment or other patient management decisions, including infection  control decisions, particularly in the presence of clinical signs and  symptoms consistent with COVID-19, or in those who have been in contact with the virus.  Negative results must be combined with clinical observations, patient history, and epidemiological information. The expected result is  Negative. Fact Sheet for Patients: PodPark.tn Fact Sheet for Healthcare Providers: GiftContent.is This test is not yet approved or cleared by the [place] FDA and  has been authorized for detection and/or diagnosis of SARS-CoV-2 by FDA under an Emergency Use Authorization (EUA).  This EUA will remain in effect (meaning this test can be used) for the duration of  the COVID-19 de claration under Section 564(b)(1) of the Act, 21 U.S.C. section 360bbb-3(b)(1), unless the authorization is terminated or revoked sooner.   Basic metabolic panel     Status: Abnormal   Collection Time: 06/19/19  2:58 AM  Result Value Ref Range   Sodium 138 135 - 145 mmol/L   Potassium 4.0 3.5 - 5.1 mmol/L   Chloride 103 98 - 111 mmol/L   CO2 26 22 - 32 mmol/L   Glucose, Bld 115 (H) 70 - 99 mg/dL   BUN 13 6 - 20 mg/dL   Creatinine, Ser 0.82 0.61 - 1.24 mg/dL    Calcium 8.7 (L) 8.9 - 10.3 mg/dL   GFR calc non Af Amer >60 >60 mL/min   GFR calc Af Amer >60 >60 mL/min   Anion gap 9 5 - 15    Comment: Performed at [facility], [place] [address] [place], [place] [address]  CBC     Status: Abnormal   Collection Time: 06/19/19  2:58 AM  Result Value Ref Range   WBC 11.5 (H) 4.0 - 10.5 K/uL   RBC 4.37 4.22 - 5.81 MIL/uL   Hemoglobin 12.1 (L) 13.0 - 17.0 g/dL   HCT 38.8 (L) 39.0 - 52.0 %   MCV 88.8 80.0 - 100.0 fL   MCH 27.7 26.0 - 34.0 pg   MCHC 31.2 30.0 - 36.0 g/dL   RDW 13.2 11.5 - 15.5 %   Platelets 322 150 - 400 K/uL   nRBC 0.0 0.0 - 0.2 %    Comment: Performed at [facility], [place] [address] [place], [place] [address]  Magnesium     Status: None   Collection Time: 06/19/19  2:58 AM  Result Value Ref Range   Magnesium 2.0 1.7 - 2.4 mg/dL    Comment: Performed at [facility], [place] [address], [place], [place] [address]  Phosphorus     Status: None   Collection Time: 06/19/19  2:58 AM  Result Value Ref Range   Phosphorus 4.4 2.5 - 4.6 mg/dL    Comment: Performed at [facility], [place] [address] [place], [place] [address]  Triglycerides     Status: None   Collection Time: 06/19/19  2:58 AM  Result Value Ref Range   Triglycerides 122 <150 mg/dL    Comment: Performed at [facility], [place] [address], [place], [place] [address]  MRSA PCR Screening     Status: None   Collection Time: 06/19/19 10:52 PM   Specimen: Nasal Mucosa; Nasopharyngeal  Result Value Ref Range   MRSA by PCR NEGATIVE NEGATIVE    Comment:        The GeneXpert MRSA Assay (FDA approved for NASAL specimens only), is one component of a comprehensive MRSA colonization surveillance program. It is not intended to diagnose MRSA infection nor to guide or monitor treatment for MRSA infections. Performed at [facility], [place] [address], [place], [place] [address]    Triglycerides     Status: None   Collection Time: 06/20/19  2:17 AM  Result Value Ref Range   Triglycerides  133 <150 mg/dL    Comment: Performed at [facility], [place] [address], [place], [place] [address]  Comprehensive metabolic panel     Status: Abnormal   Collection Time: 06/20/19  2:17 AM  Result Value Ref Range   Sodium 139 135 - 145 mmol/L   Potassium 3.6 3.5 - 5.1 mmol/L   Chloride 106 98 - 111 mmol/L   CO2 25 22 - 32 mmol/L   Glucose, Bld 80 70 - 99 mg/dL   BUN 13 6 - 20 mg/dL   Creatinine, Ser 0.81 0.61 - 1.24 mg/dL   Calcium 8.2 (L) 8.9 - 10.3 mg/dL   Total Protein 6.2 (L) 6.5 - 8.1 g/dL   Albumin 3.3 (L) 3.5 - 5.0 g/dL   AST 15 15 - 41 U/L   ALT 29 0 - 44 U/L   Alkaline Phosphatase 62 38 - 126 U/L   Total Bilirubin 1.0 0.3 - 1.2 mg/dL   GFR calc non Af Amer >60 >60 mL/min   GFR calc Af Amer >60 >60 mL/min   Anion gap 8 5 - 15    Comment: Performed at [facility], [place] [address] [place], [place] [address]    Medications:  Current Facility-Administered Medications  Medication Dose Route Frequency Provider Last Rate Last Admin  . acetaminophen (TYLENOL) tablet 650 mg  650 mg Oral Q4H PRN Rigoberto Noel, MD      . amLODipine (NORVASC) tablet 5 mg  5 mg Oral Daily Alfonzo Feller, NP   5 mg at 06/20/19 0954  . Chlorhexidine Gluconate Cloth 2 % PADS 6 each  6 each Topical Daily Rigoberto Noel, MD   6 each at 06/20/19 [phone]  . cloNIDine (CATAPRES) tablet 0.1 mg  0.1 mg Oral Q8H PRN Anders Simmonds, MD   0.1 mg at 06/20/19 0002  . enoxaparin (LOVENOX) injection 30 mg  30 mg Subcutaneous Q12H Rigoberto Noel, MD   30 mg at 06/20/19 0855  . MEDLINE mouth rinse  15 mL Mouth Rinse BID Rigoberto Noel, MD   15 mL at 06/20/19 0855  . ondansetron (ZOFRAN) injection 4 mg  4 mg Intravenous Q6H PRN Rigoberto Noel, MD        Musculoskeletal:   Psychiatric Specialty Exam: Physical Exam  Vitals reviewed. Constitutional: He appears  well-developed.  Psychiatric: He has a normal mood and affect. His behavior is normal.    Review of Systems  Psychiatric/Behavioral: Positive for sleep disturbance. Negative for suicidal ideas.  All other systems reviewed and are negative.   Blood pressure (!) 145/102, pulse 90, temperature 98.2 F (36.8 C), temperature source Oral, resp. rate 18, height '5\' 11"'$  (1.803 m), weight (!) 144.3 kg, SpO2 95 %.Body mass index is 44.37 kg/m.  General Appearance: Casual  Eye Contact:  Good  Speech:  Clear and Coherent  Volume:  Normal  Mood:  Depressed  Affect:  Congruent  Thought Process:  Coherent  Orientation:  Full (Time, Place, and Person)  Thought Content:  Hallucinations: None  Suicidal Thoughts:  No  Homicidal Thoughts:  No  Memory:  Immediate;   Fair Recent;   Fair  Judgement:  Fair  Insight:  Fair  Psychomotor Activity:  Normal  Concentration:  Concentration: Fair  Recall:  [employer] of Knowledge:  Fair  Language:  Fair  Akathisia:  No  Handed:  Right  AIMS (if indicated):     Assets:  Communication Skills Desire for Improvement Resilience  Social Support  ADL's:  Intact  Cognition:  WNL  Sleep:        Disposition: No evidence of imminent risk to self or others at present.   Patient does not meet criteria for psychiatric inpatient admission. Supportive therapy provided about ongoing stressors. Refer to IOP. Discussed crisis plan, support from social network, calling 911, coming to the Emergency Department, and calling Suicide Hotline.  This service was provided via telemedicine using a 2-way, interactive audio and video technology.  Names of all persons participating in this telemedicine service and their role in this encounter. Name: Ebenezer Mccaskey Role: patient   Name: Shanon Rosser Archie  Role: girfriend  Name: T.Leiws Role: NP        [place]ill [place], NP 06/20/2019 2:51 PM

## 2019-06-20 NOTE — Progress Notes (Addendum)
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  History is obtained from chart review, speaking to ED attending and from his girlfriend Leslie Dales who is a PACU nurse Brought in by EMS from home, initially seen after being found unresponsive by girlfriend, given 2 mg IM Narcan and 2 IN, woke up and was A&O x4, refused ER admission and said "I messed up".  Girlfriend found white powder in his possession. He became unresponsive again and she noted a slight facial droop and was dead weight, EMS was called back this time no response to 1 mg IV Narcan, after arrival to the ED received 4 mg IV Narcan without response with snoring respirations commands intubated for airway protection with etomidate and paralytic. Per girlfriend, he has been depressed lately, his mother passed away in 2023/02/23.  He is compliant with methadone and takes it from the clinic daily and has never abused this.  When he went to the funeral in [place], he was not able to take methadone and took oxycodone and Xanax which he obtained from a family member, otherwise he does not take any kind of benzodiazepines.  Girlfriend takes Tranxene but keeps this under lock and key and none of her pills are missing   SIGNIFICANT PAST MEDICAL HISTORY   History of  narcotic abuse, on methadone Overdose in his 82s  SIGNIFICANT EVENTS:  2/2 Intubated for airway protection in setting of substance overdose. Admitted to ICU  2/3 Extubated STUDIES:   Head CT 2/2 >> neg  CULTURES:  SARS Coronavirus 2 negative MRSA surveillance negative    ANTIBIOTICS:  NA  LINES/TUBES:  ETT 2/2- 2/3  CONSULTANTS:  Psych  SUBJECTIVE:  States he "feels great." No pain of difficulty breathing. Good appetite.  Hypertensive overnight requiring prn clonidine.  States he recently started a new job.   CONSTITUTIONAL: BP (!) 144/97   Pulse 96   Temp 98.2 F (36.8 C) (Oral)   Resp 17   Ht 5\' 11"  (1.803 m)   Wt (!) 144.3 kg   SpO2 100%   BMI 44.37 kg/m   I/O last 3 completed shifts: In: 5356.1 [P.O.:120; I.V.:5236.1] Out: 1700 [Urine:1400; Emesis/NG output:300]     FiO2 (%):  [30 %] 30 %  PHYSICAL EXAM: General: Well-developed, obese adult male. Sitting in recliner. NAD HENT: Normocephalic, PERRL. Moist mucus membranes Neck: No JVD. Trachea midline. No thyromegaly, no lymphadenopathy CV: RRR. S1S2. No MRG. +2 distal pulses Lungs: BBS present, clear, FNL, symmetrical ABD: +BS x4. SNT/ND. No masses, guarding or rigidity GU: No Foley EXT: MAE well. No edema Skin: PWD. In tact. No rashes or lesions Neuro: A&O to person, place, and situation. Confused as to date. Speech is slow but appropriate. No focal deficits Psych: Appropriate mood, insight and judgment for time and situation  RESOLVED PROBLEM LIST  Acute respiratory failure Acute encephalopathy ASSESSMENT AND PLAN    Acute respiratory failure in setting of decreased LOC, inability to protect airway, related to medication overdose-resolved   Acute encephalopathy, suspect toxic in setting of medication overdose-resolved     Depression Possible SI  -Girlfriend confirmed that her medication/Tranxene is locked up and none are missing   -suspected BZD overdose  -pt states he took xanax  "because I had been crying for 2 hours and just needed to relax." States his mother died in 30-Jan-2023 and he was having a particularly bad day of grieving. States he is not sure where he got the xanax from.  P -continue 1:1 for now -psych to see today    Hx opioid abuse -on methadone at home P -supportive care if sx of opioid withdrawals begin, and will continue to evaluate when safe to resume home meds.   HTN-denies h/o same. Received norvasc yesterday and prn clonidine.  P Continue norvasc and prn clonidine  SUMMARY OF TODAY'S PLAN:  Transfer out of ICU.  Psych to see.   Best Practice / Goals of Care / Disposition.   DVT PROPHYLAXIS: lovenox SUP: not indicated  NUTRITION: regular diet  MOBILITY: OOB, 1:1 sitter  GOALS OF CARE: NA FAMILY DISCUSSIONS: will update  DISPOSITION: pt may transfer out of ICU.  LABS  Glucose Recent Labs  Lab 06/18/19 1606  GLUCAP 104*    BMET Recent Labs  Lab 06/18/19 1620 06/19/19 0258 06/20/19 0217  NA 137 138 139  K 3.7 4.0 3.6  CL 102 103 106  CO2 25 26 25   BUN 14 13 13   CREATININE 0.84 0.82 0.81  GLUCOSE 108* 115* 80    Liver Enzymes Recent Labs  Lab 06/18/19 1620 06/20/19 0217  AST 30 15  ALT 46* 29  ALKPHOS 82 62  BILITOT 0.7 1.0  ALBUMIN 4.3 3.3*    Electrolytes Recent Labs  Lab 06/18/19 1620 06/19/19 0258 06/20/19 0217  CALCIUM 9.5 8.7* 8.2*  MG  --  2.0  --   PHOS  --  4.4  --     CBC Recent Labs  Lab 06/18/19 1620 06/19/19 0258  WBC 14.1* 11.5*  HGB 14.2 12.1*  HCT 44.6 38.8*  PLT 381 322    Francine Graven, MSN, AGACNP  [facility]

## 2019-06-21 MED ORDER — METHADONE HCL 10 MG/ML PO CONC: 40.0000 mg | Freq: Every day | ORAL | 0 refills | Status: AC

## 2019-06-21 MED ORDER — AMLODIPINE BESYLATE 5 MG PO TABS: 10.0000 mg | ORAL_TABLET | Freq: Every day | ORAL | 0 refills | Status: AC

## 2019-06-21 MED ORDER — METHADONE HCL 10 MG/ML PO CONC
40.0000 mg | Freq: Every day | ORAL | Status: DC
  Administered 2019-06-21: 40 mg via ORAL
  Filled 2019-06-21: qty 4

## 2019-06-21 MED ORDER — METHADONE HCL 10 MG/ML PO CONC: 95.0000 mg | Freq: Every day | ORAL | Status: DC

## 2019-06-21 NOTE — Discharge Summary (Signed)
Physician Discharge Summary  James Ashley HYH:[id number] DOB: [birth year] DOA: 06/18/2019  PCP: Sharlene Dory, DO  Admit date: 06/18/2019 Discharge date: 06/21/2019   Code Status: Full Code  Admitted From: Home Discharged to: Home Home Health: None Equipment/Devices: None Discharge Condition: Stable  Recommendations for Outpatient Follow-up   1. Needs close PCP follow-up 2. Needs close psychology follow-up and psychiatry 3. Monitor for substance abuse and depression 4. 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  Upon arrival to ED he was given Narcan without any response and was intubated for airway protection and admitted by PCCM to ICU.  UDS positive for benzodiazepines in ED.  Was noted to have taken Xanax which he obtained from a family friend as he had a depressive episode as his mother passed away recently in 2023-02-20.  He is adamant that this was not a suicide attempt but an accidental overdose.    He was successfully extubated on 2/3.  Psychiatry was consulted did not feel the patient was suicidal, homicidal or psychotic and did not need inpatient psychiatric care as this was an accidental overdose and was not trying to hurt himself.  He was given a low-dose of methadone prior to discharge and discharged with decreased dose of methadone 40 mg and recommended follow-up in methadone clinic.  He was referred to ambulatory psychology and recommended to have close PCP follow-up.  A & P   Active Problems:   Overdose of benzodiazepine, undetermined intent,  initial encounter   Acute respiratory failure in setting of decreased LOC, inability to protect airway related to accidental medication overdose Intubated 2/2, extubated 2/3 Tolerating room air at discharge  Accidental overdose secondary to benzodiazepines in setting of methadone use Psychiatry cleared patient, will need follow-up outpatient Methadone decreased at discharge  Depression No suicidal ideation Related to his mother's recent unexpected death. Given information for suicide hotline Will need close outpatient follow-up, not discharged on antidepressants at this time  Acute encephalopathy secondary to medication overdose Resolved  History of opioid abuse Methadone 95 mg outpatient confirmed by pharmacy QTc within normal range Given a dose of methadone 40 mg prior to discharge and recommended outpatient follow-up in clinic Strongly advised to abstain from any illicit drugs or benzodiazepines as this can interact with methadone      Consultants  . Psychiatry . PCCM  Procedures  . Intubated 2/2, extubated 06/19/2019  Antibiotics   Anti-infectives (From admission, onward)   None        Subjective   Seen and examined at bedside no acute distress resting comfortably.  We had a nice long discussion this afternoon regarding patient's depression and the unexpected passing of his mother back in Feb 20, 2023.  He reports that since that time he has been doing relatively well however it seems the initial "shock "recently has worn off and his depression has been much worse recently.  It is the worst while patient is alone and is controlled when he is around other people.  States that prior to admission he had obtained Xanax from a friend as he was having such bad anxiety/depression, just '  to take the edge off.'  He took 3 Xanax pills but does not know the dose.  Unfortunately he did not think about this interacting with his methadone.  He states that he was very close with his  mother and has a good support system in his girlfriend.  Also states that he is ready to start seeing a therapist and get his depression under control.  Currently denies any chest pain, nausea, vomiting, diarrhea, constipation.  Does state that occasionally he gets night sweats and abdominal pain while he is been hospitalized since he has not had his methadone and is concerned about withdrawal symptoms.  No other complaints at this time  Objective   Discharge Exam: Vitals:   06/21/19 0630 06/21/19 1344  BP: 129/79 (!) 151/86  Pulse: 76 87  Resp: 17 16  Temp: 98 F (36.7 C) 98.1 F (36.7 C)  SpO2: 96% 95%   Vitals:   06/20/19 1106 06/20/19 1453 06/21/19 0630 06/21/19 1344  BP: (!) 145/102 (!) 142/89 129/79 (!) 151/86  Pulse: 90 99 76 87  Resp: 18 20 17 16   Temp:  98.2 F (36.8 C) 98 F (36.7 C) 98.1 F (36.7 C)  TempSrc:  Oral Oral Oral  SpO2: 95% 96% 96% 95%  Weight:      Height:        Physical Exam Vitals and nursing note reviewed.  HENT:     Mouth/Throat:     Mouth: Mucous membranes are moist.  Eyes:     Conjunctiva/sclera: Conjunctivae normal.  Cardiovascular:     Rate and Rhythm: Normal rate and regular rhythm.  Pulmonary:     Effort: Pulmonary effort is normal.     Breath sounds: Normal breath sounds.  Abdominal:     General: Abdomen is flat. There is no distension.  Musculoskeletal:        General: No swelling or tenderness.  Skin:    General: Skin is warm.     Coloration: Skin is not jaundiced.  Neurological:     Mental Status: He is alert. Mental status is at baseline.  Psychiatric:        Thought Content: Thought content normal.     Comments: Tearful, depressed No suicidal or homicidal ideation       The results of significant diagnostics from this hospitalization (including imaging, microbiology, ancillary and laboratory) are listed below for reference.     Microbiology: Recent Results (from the past 240 hour(s))  Respiratory Panel by RT  PCR (Flu A&B, Covid) - Nasopharyngeal Swab     Status: None   Collection Time: 06/18/19  4:48 PM   Specimen: Nasopharyngeal Swab  Result Value Ref Range Status   SARS Coronavirus 2 by RT PCR NEGATIVE NEGATIVE Final    Comment: (NOTE) SARS-CoV-2 target nucleic acids are NOT DETECTED. The SARS-CoV-2 RNA is generally detectable in upper respiratoy specimens during the acute phase of infection. The lowest concentration of SARS-CoV-2 viral copies this assay can detect is 131 copies/mL. A negative result does not preclude SARS-Cov-2 infection and should not be used as the sole basis for treatment or other patient management decisions. A negative result may occur with  improper specimen collection/handling, submission of specimen other than nasopharyngeal swab, presence of viral mutation(s) within the areas targeted by this assay, and inadequate number of viral copies (<131 copies/mL). A negative result must be combined with clinical observations, patient history, and epidemiological information. The expected result is Negative. Fact Sheet for Patients:  https://www.moore.com/https://www.fda.gov/media/142436/download Fact Sheet  for Healthcare Providers:  GravelBags.it This test is not yet ap proved or cleared by the [place] and  has been authorized for detection and/or diagnosis of SARS-CoV-2 by FDA under an Emergency Use Authorization (EUA). This EUA will remain  in effect (meaning this test can be used) for the duration of the COVID-19 declaration under Section 564(b)(1) of the Act, 21 U.S.C. section 360bbb-3(b)(1), unless the authorization is terminated or revoked sooner.    Influenza A by PCR NEGATIVE NEGATIVE Final   Influenza B by PCR NEGATIVE NEGATIVE Final    Comment: (NOTE) The Xpert Xpress SARS-CoV-2/FLU/RSV assay is intended as an aid in  the diagnosis of influenza from Nasopharyngeal swab specimens and  should not be used as a sole basis for treatment. Nasal  washings and  aspirates are unacceptable for Xpert Xpress SARS-CoV-2/FLU/RSV  testing. Fact Sheet for Patients: PinkCheek.be Fact Sheet for Healthcare Providers: GravelBags.it This test is not yet approved or cleared by the [place] FDA and  has been authorized for detection and/or diagnosis of SARS-CoV-2 by  FDA under an Emergency Use Authorization (EUA). This EUA will remain  in effect (meaning this test can be used) for the duration of the  Covid-19 declaration under Section 564(b)(1) of the Act, 21  U.S.C. section 360bbb-3(b)(1), unless the authorization is  terminated or revoked. Performed at [facility], [place] [address], [place], [place] [address]   MRSA PCR Screening     Status: None   Collection Time: 06/19/19 10:52 PM   Specimen: Nasal Mucosa; Nasopharyngeal  Result Value Ref Range Status   MRSA by PCR NEGATIVE NEGATIVE Final    Comment:        The GeneXpert MRSA Assay (FDA approved for NASAL specimens only), is one component of a comprehensive MRSA colonization surveillance program. It is not intended to diagnose MRSA infection nor to guide or monitor treatment for MRSA infections. Performed at [facility], [place] [address], [place],  [address]      Labs: BNP (last 3 results) No results for input(s): BNP in the last 8760 hours. Basic Metabolic Panel: Recent Labs  Lab 06/18/19 1620 06/19/19 0258 06/20/19 0217  NA 137 138 139  K 3.7 4.0 3.6  CL 102 103 106  CO2 25 26 25   GLUCOSE 108* 115* 80  BUN 14 13 13   CREATININE 0.84 0.82 0.81  CALCIUM 9.5 8.7* 8.2*  MG  --  2.0  --   PHOS  --  4.4  --    Liver Function Tests: Recent Labs  Lab 06/18/19 1620 06/20/19 0217  AST 30 15  ALT 46* 29  ALKPHOS 82 62  BILITOT 0.7 1.0  PROT 8.0 6.2*  ALBUMIN 4.3 3.3*   No results for input(s): LIPASE, AMYLASE in the last 168 hours. No results for input(s):  AMMONIA in the last 168 hours. CBC: Recent Labs  Lab 06/18/19 1620 06/19/19 0258  WBC 14.1* 11.5*  NEUTROABS 10.1*  --   HGB 14.2 12.1*  HCT 44.6 38.8*  MCV 89.0 88.8  PLT 381 322   Cardiac Enzymes: No results for input(s): CKTOTAL, CKMB, CKMBINDEX, TROPONINI in the last 168 hours. BNP: Invalid input(s): POCBNP CBG: Recent Labs  Lab 06/18/19 1606  GLUCAP 104*   D-Dimer No results for input(s): DDIMER in the last 72 hours. Hgb A1c No results for input(s): HGBA1C in the last 72 hours. Lipid Profile Recent Labs    06/19/19 0258 06/20/19 0217  TRIG 122 133  Thyroid function studies No results for input(s): TSH, T4TOTAL, T3FREE, THYROIDAB in the last 72 hours.  Invalid input(s): FREET3 Anemia work up No results for input(s): VITAMINB12, FOLATE, FERRITIN, TIBC, IRON, RETICCTPCT in the last 72 hours. Urinalysis    Component Value Date/Time   COLORURINE YELLOW 06/18/2019 1605   APPEARANCEUR CLEAR 06/18/2019 1605   LABSPEC 1.011 06/18/2019 1605   PHURINE 8.0 06/18/2019 1605   GLUCOSEU NEGATIVE 06/18/2019 1605   HGBUR NEGATIVE 06/18/2019 1605   BILIRUBINUR NEGATIVE 06/18/2019 1605   KETONESUR NEGATIVE 06/18/2019 1605   PROTEINUR NEGATIVE 06/18/2019 1605   NITRITE NEGATIVE 06/18/2019 1605   LEUKOCYTESUR NEGATIVE 06/18/2019 1605   Sepsis Labs Invalid input(s): PROCALCITONIN,  WBC,  LACTICIDVEN Microbiology Recent Results (from the past 240 hour(s))  Respiratory Panel by RT PCR (Flu A&B, Covid) - Nasopharyngeal Swab     Status: None   Collection Time: 06/18/19  4:48 PM   Specimen: Nasopharyngeal Swab  Result Value Ref Range Status   SARS Coronavirus 2 by RT PCR NEGATIVE NEGATIVE Final    Comment: (NOTE) SARS-CoV-2 target nucleic acids are NOT DETECTED. The SARS-CoV-2 RNA is generally detectable in upper respiratoy specimens during the acute phase of infection. The lowest concentration of SARS-CoV-2 viral copies this assay can detect is 131 copies/mL. A  negative result does not preclude SARS-Cov-2 infection and should not be used as the sole basis for treatment or other patient management decisions. A negative result may occur with  improper specimen collection/handling, submission of specimen other than nasopharyngeal swab, presence of viral mutation(s) within the areas targeted by this assay, and inadequate number of viral copies (<131 copies/mL). A negative result must be combined with clinical observations, patient history, and epidemiological information. The expected result is Negative. Fact Sheet for Patients:  https://www.moore.com/ Fact Sheet for Healthcare Providers:  https://www.young.biz/ This test is not yet ap proved or cleared by the [place] FDA and  has been authorized for detection and/or diagnosis of SARS-CoV-2 by FDA under an Emergency Use Authorization (EUA). This EUA will remain  in effect (meaning this test can be used) for the duration of the COVID-19 declaration under Section 564(b)(1) of the Act, 21 U.S.C. section 360bbb-3(b)(1), unless the authorization is terminated or revoked sooner.    Influenza A by PCR NEGATIVE NEGATIVE Final   Influenza B by PCR NEGATIVE NEGATIVE Final    Comment: (NOTE) The Xpert Xpress SARS-CoV-2/FLU/RSV assay is intended as an aid in  the diagnosis of influenza from Nasopharyngeal swab specimens and  should not be used as a sole basis for treatment. Nasal washings and  aspirates are unacceptable for Xpert Xpress SARS-CoV-2/FLU/RSV  testing. Fact Sheet for Patients: https://www.moore.com/ Fact Sheet for Healthcare Providers: https://www.young.biz/ This test is not yet approved or cleared by the [place] FDA and  has been authorized for detection and/or diagnosis of SARS-CoV-2 by  FDA under an Emergency Use Authorization (EUA). This EUA will remain  in effect (meaning this test can be used) for  the duration of the  Covid-19 declaration under Section 564(b)(1) of the Act, 21  U.S.C. section 360bbb-3(b)(1), unless the authorization is  terminated or revoked. Performed at [facility], [address] [place], [place] [address]   MRSA PCR Screening     Status: None   Collection Time: 06/19/19 10:52 PM   Specimen: Nasal Mucosa; Nasopharyngeal  Result Value Ref Range Status   MRSA by PCR NEGATIVE NEGATIVE Final    Comment:  The GeneXpert MRSA Assay (FDA approved for NASAL specimens only), is one component of a comprehensive MRSA colonization surveillance program. It is not intended to diagnose MRSA infection nor to guide or monitor treatment for MRSA infections. Performed at [facility], [address], [place], [place] [address]     Discharge Instructions     Discharge Instructions    Ambulatory referral to Psychology   Complete by: As directed    Diet - low sodium heart healthy   Complete by: As directed    Discharge instructions   Complete by: As directed    You were seen and examined in the hospital for overdose and cared for by a hospitalist, pulmonary/critical care doctor and psychiatrist.   Upon Discharge:  - Refrain from using any illicit substances and medications that are not prescribed to you - Your methadone dose has been decreased, please follow up in the methadone clinic as usual for further management - You are being referred to psychology for management of your depression. If you have any thoughts or plans of suicide please do not hesitate to contact the suicide hotline [phone] or call 911 - You are also being started on amlodipine for blood pressure management - Make an appointment with your primary care physician within 7 days  Bring all home medications to your appointment to review Request that your primary physician go over all hospital tests and procedures/radiological results at the follow  up.   Please get all hospital records sent to your physician by signing a hospital release before you go home.     Read the complete instructions along with all the possible side effects for all the medicines you take and that have been prescribed to you. Take any new medicines after you have completely understood and accept all the possible adverse reactions/side effects.   If you have any questions about your discharge medications or the care you received while you were in the hospital, you can call the unit and asked to speak with the hospitalist on call. Once you are discharged, your primary care physician will handle any further medical issues. Please note that NO REFILLS for any discharge medications will be authorized, as it is imperative that you return to your primary care physician (or establish a relationship with a primary care physician if you do not have one) for your aftercare needs so that they can reassess your need for medications and monitor your lab values.   Do not drive, operate heavy machinery, perform activities at heights, swimming or participation in water activities or provide baby sitting services if your were admitted for loss of consciousness/seizures or if you are on sedating medications including, but not limited to benzodiazepines, sleep medications, narcotic pain medications, etc., until you have been cleared to do so by a medical doctor.   Do not take more than prescribed medications.   Wear a seat belt while driving.  If you have smoked or chewed Tobacco in the last 2 years please stop smoking; also stop any regular Alcohol and/or any Recreational drug use including marijuana.  If you experience worsening of your admission symptoms or develop shortness of breath, chest pain, suicidal or homicidal thoughts or experience a life threatening emergency, you must seek medical attention immediately by calling 911 or calling your PCP immediately.   Increase activity  slowly   Complete by: As directed      Allergies as of 06/21/2019   No Known Allergies     Medication  List    STOP taking these medications   meloxicam 15 MG tablet Commonly known as: MOBIC   naproxen 500 MG tablet Commonly known as: NAPROSYN     TAKE these medications   amLODipine 5 MG tablet Commonly known as: NORVASC Take 2 tablets (10 mg total) by mouth daily. Start taking on: June 22, 2019   ibuprofen 200 MG tablet Commonly known as: ADVIL Take 600 mg by mouth every 6 (six) hours as needed for headache or moderate pain.   methadone 10 MG/ML solution Commonly known as: DOLOPHINE Take 4 mLs (40 mg total) by mouth daily. What changed:   how much to take  additional instructions       No Known Allergies  Time coordinating discharge: Over 30 minutes   SIGNED:   Jae Dire, D.O. [facility] Pager: [phone]  06/21/2019, 4:43 PM

## 2019-06-24 ENCOUNTER — Other Ambulatory Visit: Payer: Self-pay | Admitting: *Deleted

## 2019-06-24 ENCOUNTER — Telehealth: Payer: Self-pay | Admitting: *Deleted

## 2019-06-24 NOTE — Telephone Encounter (Signed)
Unable to reach pt. LVM to call office and schedule appt.

## 2019-06-24 NOTE — Patient Outreach (Signed)
[facility]) Care Management  06/24/2019  James Ashley 07/03/1980 [id number]  Transition of care telephone call  Referral received:06/19/19 Initial outreach:06/24/19 Insurance: [facility] Focus  Initial unsuccessful telephone call to patient's preferred number in order to complete transition of care assessment; no answer, left HIPAA compliant voicemail message requesting return call.   Objective: Per the electronic medical record, Mr. Counterman was hospitalized at [facility]  from 2/2-06/21/19 for accidental overdose of benzodiazepine . Comorbidities include :  Depression , substance abuse on methadone. He was discharged to home on 06/21/19,  without the need for home health services or durable medical equipment per the discharge summary.   Plan: This RNCM will route unsuccessful outreach letter with [facility] Care Management pamphlet and 24 hour Nurse Advice Line Magnet to [employer] Care Management clinical pool to be mailed to patient's home address. This RNCM will attempt another outreach within 4 business days.   [name], RN, BSN  [facility] [address] Care Management,Care Management Coordinator  [phone]- Mobile [phone]- Toll Free Main Office

## 2019-06-25 LAB — DRUG PROFILE, UR, 9 DRUGS (LABCORP)
Amphetamines, Urine: NEGATIVE ng/mL
Barbiturate, Ur: NEGATIVE ng/mL
Benzodiazepine Quant, Ur: NEGATIVE ng/mL
Cannabinoid Quant, Ur: NEGATIVE ng/mL
Cocaine (Metab.): NEGATIVE ng/mL
Methadone Screen, Urine: UNDETERMINED ng/mL
Opiate Quant, Ur: NEGATIVE ng/mL
Phencyclidine, Ur: NEGATIVE ng/mL
Propoxyphene, Urine: NEGATIVE ng/mL

## 2019-06-25 NOTE — Telephone Encounter (Signed)
Unable to reach pt--voice mailbox full. Unable to leave another voicemail. I have made two attempts and have been unable to reach patient. I have mailed the patient a letter requesting they call the office to schedule a hospital follow up appointment.

## 2019-06-27 ENCOUNTER — Other Ambulatory Visit: Payer: Self-pay | Admitting: *Deleted

## 2019-06-27 NOTE — Patient Outreach (Signed)
[facility]) Care Management  06/27/2019  SRIYAN CUTTING [birth year] [id number]   Transition of care call Referral received: 06/19/19 Initial outreach attempt: 06/24/19 Insurance: [place] Focus Plan    2nd unsuccessful telephone call to patient's preferred contact number in order to complete post hospital discharge transition of care assessment , no answer voice mail box is full unable to leave a message.    Objective: Per the electronic medical record, Mr. Geiman was hospitalized at [facility]  from 2/2-06/21/19 for accidental overdose of benzodiazepine . Comorbidities include :  Depression , substance abuse on methadone. He was discharged to home on 06/21/19,  without the need for home health services or durable medical equipment per the discharge summary.    Plan If no return call from patient will attempt 3rd outreach in the next 4 business days.    [name], RN, BSN  [facility] Care Management,Care Management Coordinator  [phone]- Mobile [phone]- Toll Free Main Office

## 2019-07-02 ENCOUNTER — Other Ambulatory Visit: Payer: Self-pay | Admitting: *Deleted

## 2019-07-02 NOTE — Patient Outreach (Signed)
[facility]) Care Management  07/02/2019  NAKOTA ELSEN 1980-08-10 [id number]   Transition of care call Referral received: 06/19/19 Initial outreach attempt: 06/24/19 Insurance: Focus Plan  Third unsuccessful telephone call to patient's preferred contact number in order to complete post hospital discharge transition of care assessment; no answer, left HIPAA compliant message requesting return call.   Objective: Per the electronic medical record, Mr. Brule was hospitalized at [facility]  from 2/2-06/21/19 for accidental overdose of benzodiazepine . Comorbidities include :  Depression , substance abuse on methadone. He was discharged to home on 06/21/19,  without the need for home health services or durable medical equipment per the discharge summary.  Plan: If no return call from patient, will close case to [facility] Care Management services in 10 business days after initial post hospital discharge outreach, on 2/8 /21.  [name], RN, BSN  [facility] Care Management,Care Management Coordinator  [phone]- Mobile [phone]- Toll Free Main Office

## 2019-07-05 ENCOUNTER — Other Ambulatory Visit: Payer: Self-pay | Admitting: *Deleted

## 2019-07-05 NOTE — Patient Outreach (Signed)
[facility]) Care Management  07/05/2019  James Ashley [birth year] [id number]  Transition of care /Case Closure Unsuccessful outreach    Referral received:06/19/19 Initial outreach:06/24/19 Insurance: [place] Focus   Unable to complete post hospital discharge transition of care assessment. No return call form patient after 3 call attempts and no response to request to contact RN Care Coordinator in unsuccessful outreach letter mailed to home on 2/8 /21.  Objective:Per the electronic medical record, James Ashley was hospitalized at [facility]  from 2/2-06/21/19 for accidental overdose of benzodiazepine . Comorbidities include :  Depression , substance abuse on methadone. He was discharged to home on 06/21/19,  without the need for home health services or durable medical equipment per the discharge summary   Plan Case closed to [facility] care management services as it has been 10 days since initial post discharge outreach attempt.   [name], RN, BSN  [facility] Care Management,Care Management Coordinator  [phone]- Mobile [phone]- Toll Free Main Office

## 2019-07-23 ENCOUNTER — Ambulatory Visit: Payer: Self-pay | Admitting: Psychology

## 2019-10-05 IMAGING — MR MRI OF THE RIGHT ANKLE WITHOUT CONTRAST
4 of 5 series · 13 of 40 positions shown · non-contrast
Comparison: Right ankle x-rays dated April 25, 2018.

CLINICAL DATA: Persistent lateral ankle pain and swelling since
[REDACTED]. No injury.

EXAM:
MRI OF THE RIGHT ANKLE WITHOUT CONTRAST
TECHNIQUE: Multiplanar, multisequence MR imaging of the ankle was performed. No
intravenous contrast was administered.

[Series 4: T2 fat-sat · axial · right · 3.0mm · 0.25mm/px · z∈[-89,+22]mm · 3 of 38 slices shown (1 of 2)]
[im 5/38]
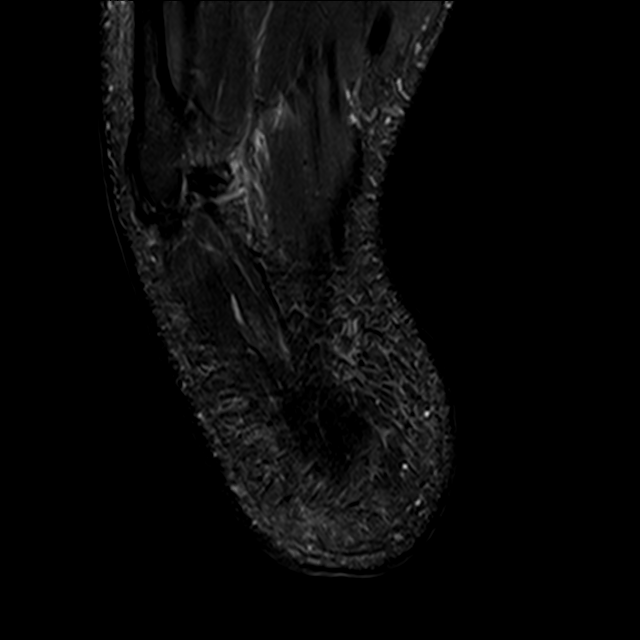
[im 19/38]
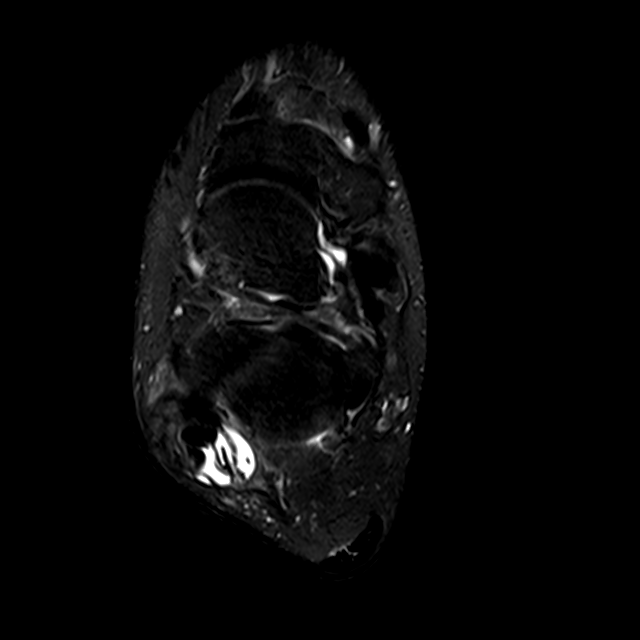
[im 33/38]
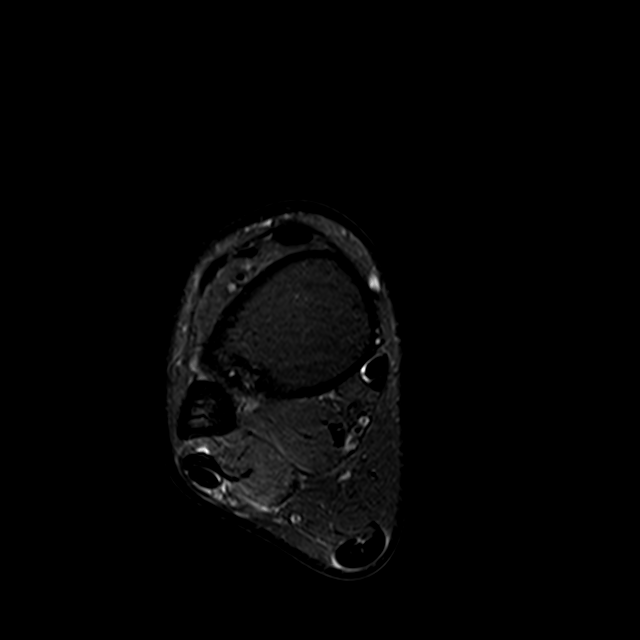

[Series 5: T1 · sagittal · right · 4.0mm · 0.27mm/px · 3 of 24 slices shown]
[im 5/24]
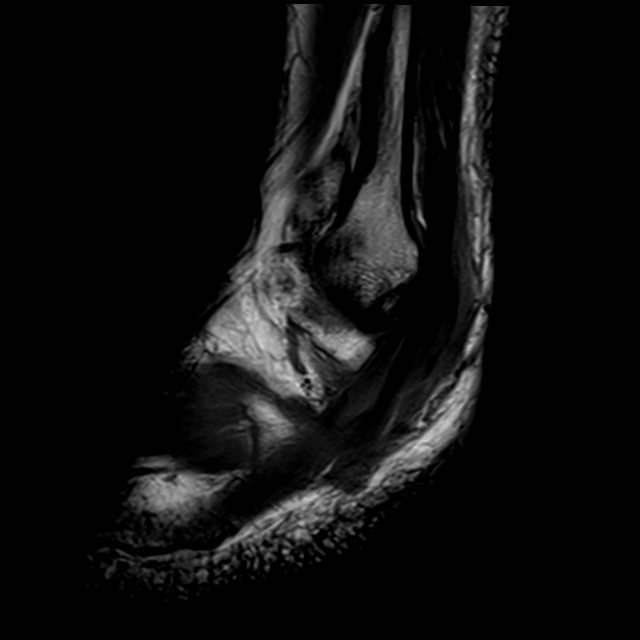
[im 14/24]
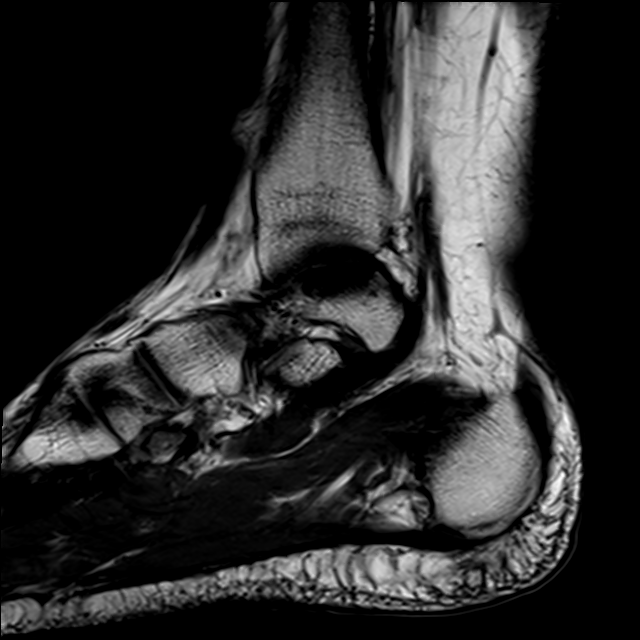
[im 24/24]
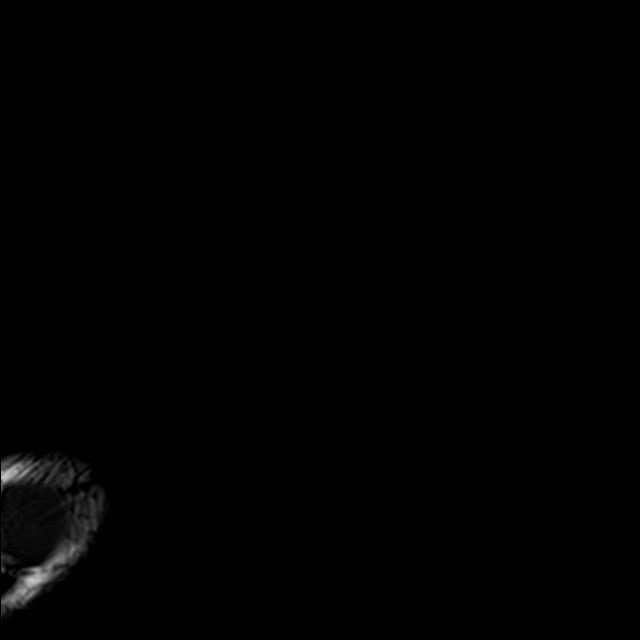

[Series 7: T2 fat-sat · coronal · right · 3.0mm · 0.25mm/px · 3 of 36 slices shown (2 of 2)]
[im 5/36]
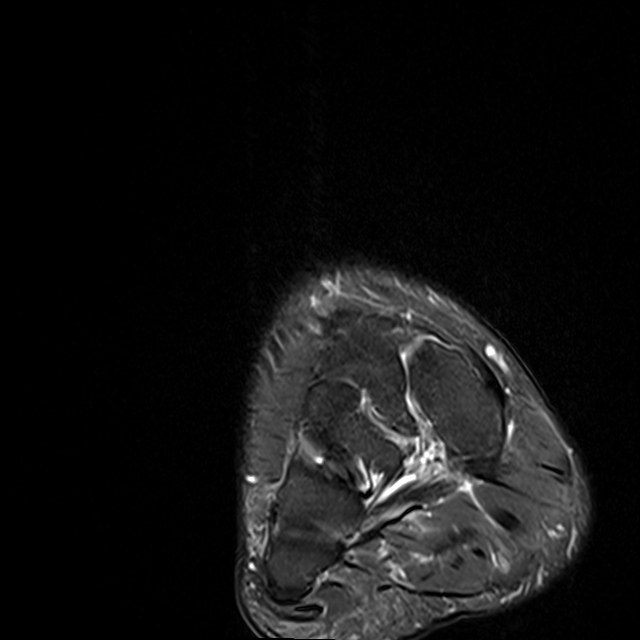
[im 18/36]
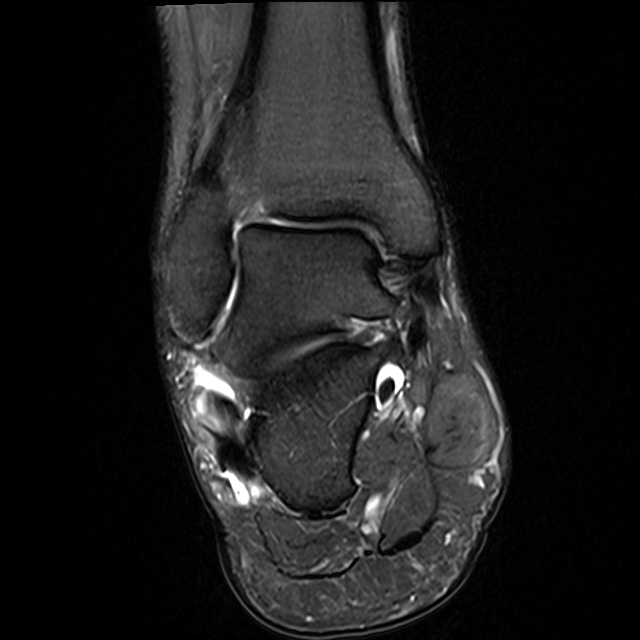
[im 31/36]
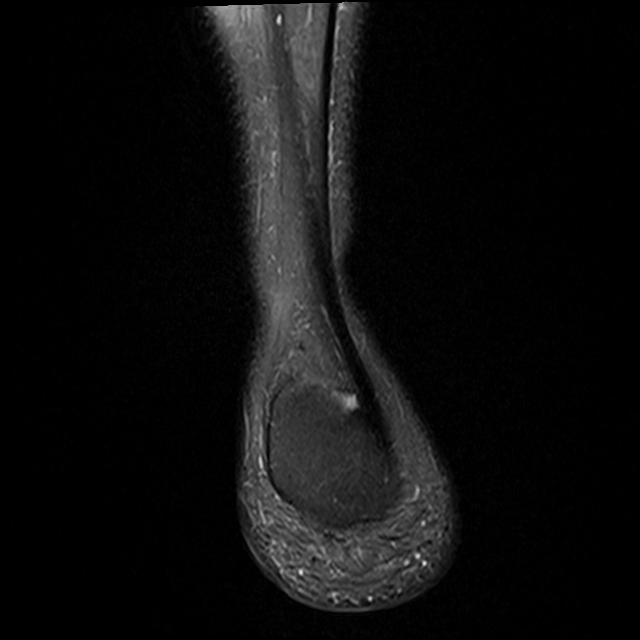

[Series 8: PD fat-sat · axial · right · 3.0mm · 0.25mm/px · z∈[-104,+22]mm · 4 of 38 slices shown]
[im 1/38]
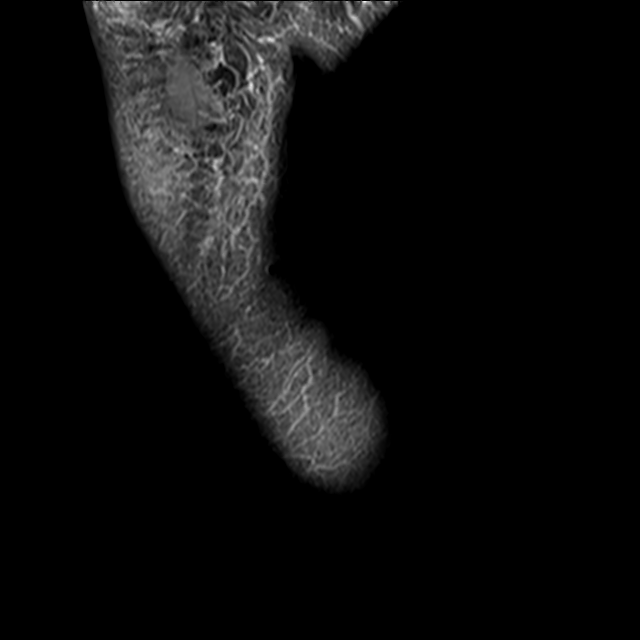
[im 5/38]
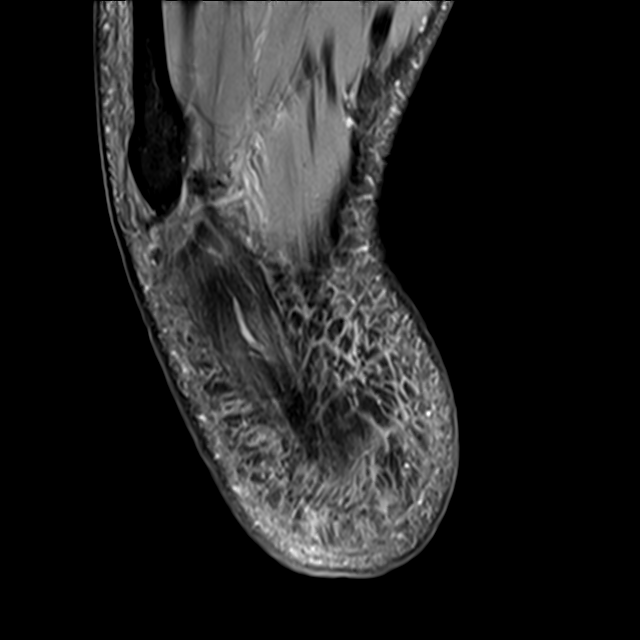
[im 21/38]
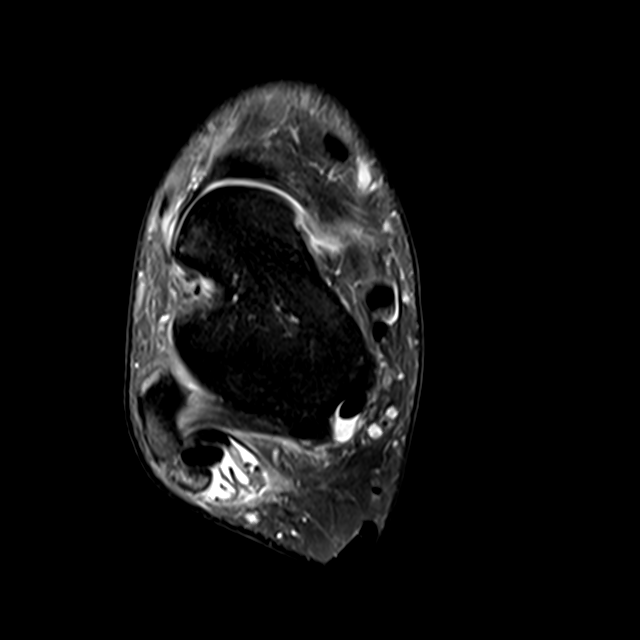
[im 33/38]
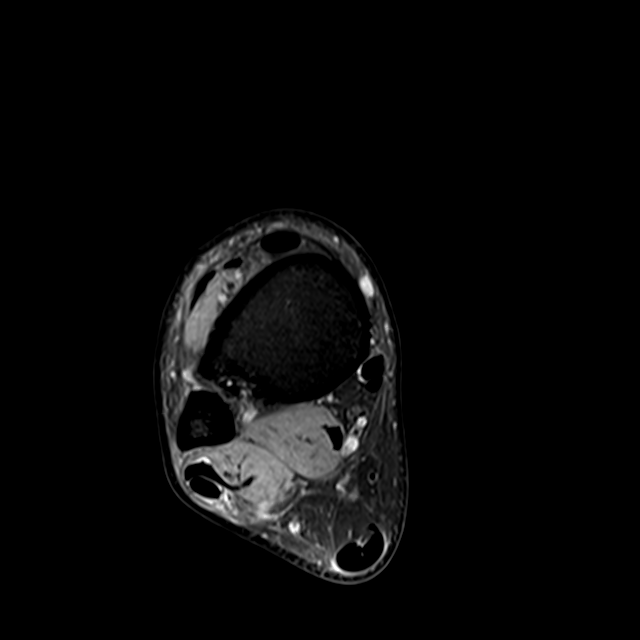

[13 of 40 positions shown; findings below may reference images not displayed]

FINDINGS: TENDONS

Peroneal: Peroneal longus tendon intact with mild tendinosis
distally beneath the midfoot. Severe peroneal brevis tendinosis with
large split tear spanning approximately 6 cm in length. Large amount
of fluid in the peroneal tendon sheaths.

Posteromedial: Posterior tibial tendon intact. Flexor hallucis
longus tendon intact with small amount of fluid in the tendon
sheath. Flexor digitorum longus tendon intact.

Anterior: Tibialis anterior tendon intact. Extensor hallucis longus
tendon intact Extensor digitorum longus tendon intact.

Achilles:  Intact.

Plantar Fascia: Intact.

LIGAMENTS

Lateral: Anterior talofibular ligament is chronically torn.
Calcaneofibular ligament intact. Posterior talofibular ligament
intact. Anterior and posterior tibiofibular ligaments intact.

Medial: Deltoid ligament intact. Spring ligament intact.

CARTILAGE

Ankle Joint: No joint effusion. Normal ankle mortise. No chondral
defect.

Subtalar Joints/Sinus Tarsi: Normal subtalar joints. No subtalar
joint effusion. Normal sinus tarsi.

Bones: No marrow signal abnormality.  No fracture or dislocation.

Soft Tissue: No soft tissue mass or fluid collection.
IMPRESSION: 1. Severe peroneal tenosynovitis with large peroneal brevis split
tear spanning approximately 6 cm in length.
2. Mild flexor hallucis longus tenosynovitis.
3. Chronically torn anterior talofibular ligament.

## 2020-07-10 IMAGING — DX DG CHEST 1V PORT
1 series · 1 of 1 positions shown · non-contrast
Comparison: 06/18/2019

CLINICAL DATA: Respiratory failure.

EXAM:
PORTABLE CHEST 1 VIEW

[chest ap]
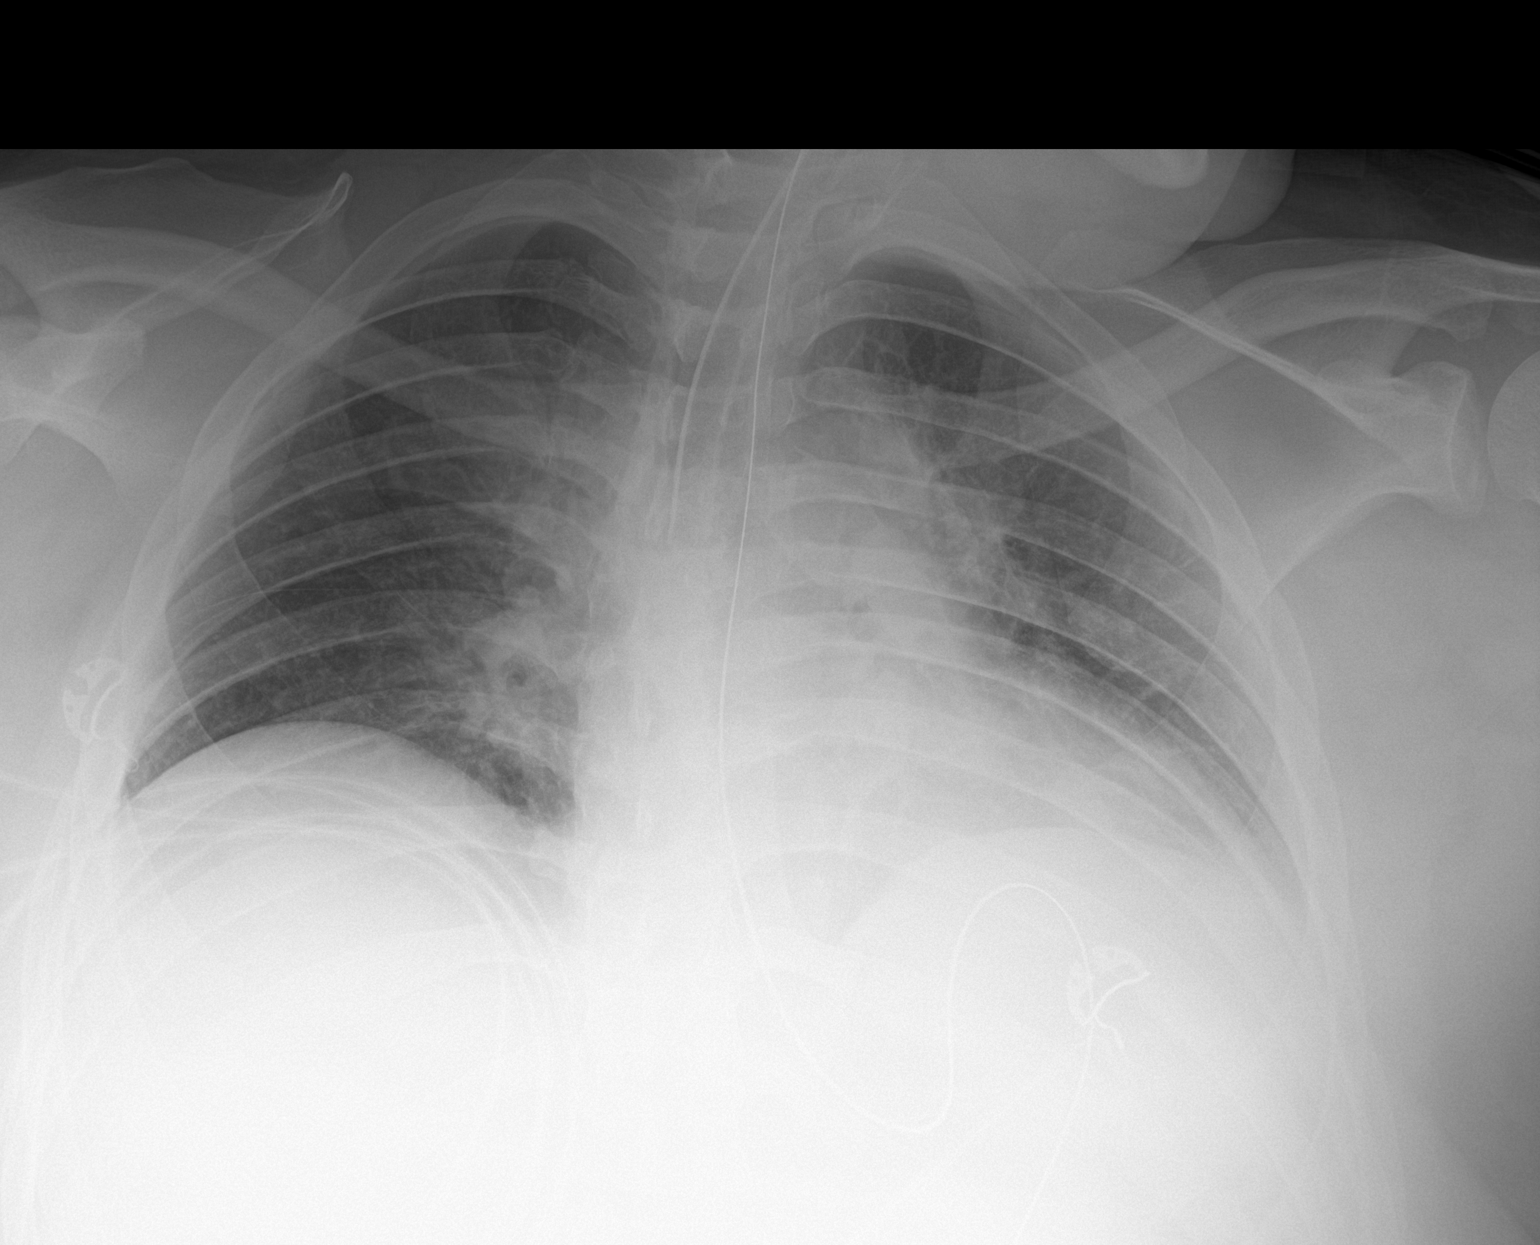

[1 of 1 positions shown; findings below may reference images not displayed]

FINDINGS: The endotracheal tube is right at the carina at the origin of the
right mainstem bronchus and should be retracted at least 3 cm. The
NG tube is coursing down the esophagus and into the stomach.

Persistent low lung volumes with vascular crowding. Patchy bilateral
infiltrates. No pleural effusions.
IMPRESSION: The endotracheal tube is at the origin of the right mainstem
bronchus and should be retracted at least 3 cm.

Patchy bilateral infiltrates, left greater than right.

These results will be called to the ordering clinician or
representative by the Radiologist Assistant, and communication
documented in the PACS or zVision Dashboard.

## 2023-04-06 ENCOUNTER — Ambulatory Visit: Payer: 59 | Admitting: Family Medicine

## 2023-04-17 DIAGNOSIS — Z79899 Other long term (current) drug therapy: Secondary | ICD-10-CM | POA: Diagnosis not present

## 2023-04-26 ENCOUNTER — Ambulatory Visit: Payer: 59 | Admitting: Family Medicine

## 2023-04-26 DIAGNOSIS — Z0000 Encounter for general adult medical examination without abnormal findings: Secondary | ICD-10-CM

## 2023-05-12 DIAGNOSIS — Z6838 Body mass index (BMI) 38.0-38.9, adult: Secondary | ICD-10-CM | POA: Diagnosis not present

## 2023-05-12 DIAGNOSIS — F411 Generalized anxiety disorder: Secondary | ICD-10-CM | POA: Diagnosis not present

## 2023-05-12 DIAGNOSIS — R7303 Prediabetes: Secondary | ICD-10-CM | POA: Diagnosis not present

## 2023-05-12 DIAGNOSIS — Z79899 Other long term (current) drug therapy: Secondary | ICD-10-CM | POA: Diagnosis not present

## 2023-05-12 DIAGNOSIS — E559 Vitamin D deficiency, unspecified: Secondary | ICD-10-CM | POA: Diagnosis not present

## 2023-05-12 DIAGNOSIS — Z2821 Immunization not carried out because of patient refusal: Secondary | ICD-10-CM | POA: Diagnosis not present

## 2023-06-12 DIAGNOSIS — Z6838 Body mass index (BMI) 38.0-38.9, adult: Secondary | ICD-10-CM | POA: Diagnosis not present

## 2023-06-12 DIAGNOSIS — Z79899 Other long term (current) drug therapy: Secondary | ICD-10-CM | POA: Diagnosis not present

## 2023-06-12 DIAGNOSIS — E559 Vitamin D deficiency, unspecified: Secondary | ICD-10-CM | POA: Diagnosis not present

## 2023-06-12 DIAGNOSIS — Z2821 Immunization not carried out because of patient refusal: Secondary | ICD-10-CM | POA: Diagnosis not present

## 2023-06-12 DIAGNOSIS — R7303 Prediabetes: Secondary | ICD-10-CM | POA: Diagnosis not present

## 2023-06-12 DIAGNOSIS — F411 Generalized anxiety disorder: Secondary | ICD-10-CM | POA: Diagnosis not present

## 2023-06-15 DIAGNOSIS — Z79899 Other long term (current) drug therapy: Secondary | ICD-10-CM | POA: Diagnosis not present

## 2023-06-20 DIAGNOSIS — R7303 Prediabetes: Secondary | ICD-10-CM | POA: Diagnosis not present

## 2023-06-20 DIAGNOSIS — Z79899 Other long term (current) drug therapy: Secondary | ICD-10-CM | POA: Diagnosis not present

## 2023-06-20 DIAGNOSIS — E559 Vitamin D deficiency, unspecified: Secondary | ICD-10-CM | POA: Diagnosis not present

## 2023-06-20 DIAGNOSIS — Z6838 Body mass index (BMI) 38.0-38.9, adult: Secondary | ICD-10-CM | POA: Diagnosis not present

## 2023-06-20 DIAGNOSIS — F411 Generalized anxiety disorder: Secondary | ICD-10-CM | POA: Diagnosis not present

## 2023-06-23 DIAGNOSIS — Z79899 Other long term (current) drug therapy: Secondary | ICD-10-CM | POA: Diagnosis not present

## 2023-07-04 DIAGNOSIS — F411 Generalized anxiety disorder: Secondary | ICD-10-CM | POA: Diagnosis not present

## 2023-07-04 DIAGNOSIS — F331 Major depressive disorder, recurrent, moderate: Secondary | ICD-10-CM | POA: Diagnosis not present

## 2023-07-10 ENCOUNTER — Telehealth: Payer: Self-pay | Admitting: Neurology

## 2023-07-10 NOTE — Telephone Encounter (Signed)
 Copied from CRM [id number]. Topic: Appointments - Appointment Scheduling >> Jul 10, 2023 11:31 AM Alcus Dad wrote: Patient/patient representative is calling to schedule an appointment with Dr. Reola Calkins. Couldn't schedule because patient was a no show. Wife stated that they actually called into the office to Cancel because the insurance wasn't situated. Please give a call back if Dr. Reola Calkins can approve them as patients    I don't see a dismissal letter, but looks like he no showed after he cancelled. Please advise?

## 2023-08-01 DIAGNOSIS — F411 Generalized anxiety disorder: Secondary | ICD-10-CM | POA: Diagnosis not present

## 2023-08-01 DIAGNOSIS — F331 Major depressive disorder, recurrent, moderate: Secondary | ICD-10-CM | POA: Diagnosis not present

## 2023-08-18 ENCOUNTER — Ambulatory Visit: Payer: MEDICAID | Admitting: Family Medicine

## 2023-08-29 DIAGNOSIS — F411 Generalized anxiety disorder: Secondary | ICD-10-CM | POA: Diagnosis not present

## 2023-08-29 DIAGNOSIS — F331 Major depressive disorder, recurrent, moderate: Secondary | ICD-10-CM | POA: Diagnosis not present

## 2023-09-26 ENCOUNTER — Ambulatory Visit: Payer: Self-pay | Admitting: Internal Medicine

## 2023-09-26 NOTE — Progress Notes (Deleted)
  [facility] [address] [place] [address] Dept: [phone] Dept Fax: [phone]  New Patient Office Visit  Subjective:   James Ashley Feb 27, 1981 09/26/2023  No chief complaint on file.   HPI: SAIF DESO presents today to establish care at [facility] [employer]. Introduced to Publishing rights manager role and practice setting.  All questions answered.  Concerns: See below      The following portions of the patient's history were reviewed and updated as appropriate: past medical history, past surgical history, family history, social history, allergies, medications, and problem list.   Patient Active Problem List   Diagnosis Date Noted   Overdose of benzodiazepine, undetermined intent, initial encounter 06/18/2019   Chronic pain of right ankle 08/31/2018   Past Medical History:  Diagnosis Date   No known health problems    Past Surgical History:  Procedure Laterality Date   HAND SURGERY     Family History  Problem Relation Age of Onset   Cancer Neg Hx     Current Outpatient Medications:    amLODipine  (NORVASC ) 5 MG tablet, Take 2 tablets (10 mg total) by mouth daily., Disp: 60 tablet, Rfl: 0   ibuprofen (ADVIL) 200 MG tablet, Take 600 mg by mouth every 6 (six) hours as needed for headache or moderate pain., Disp: , Rfl:    methadone  (DOLOPHINE ) 10 MG/ML solution, Take 4 mLs (40 mg total) by mouth daily., Disp:  , Rfl: 0 No Known Allergies  ROS: A complete ROS was performed with pertinent positives/negatives noted in the HPI. The remainder of the ROS are negative.   Objective:   There were no vitals filed for this visit.  GENERAL: Well-appearing, in NAD. Well nourished.  SKIN: Pink, warm and dry. No rash, lesion, ulceration, or ecchymoses.  NECK: Trachea midline. Full ROM w/o pain or tenderness. No lymphadenopathy.  RESPIRATORY: Chest wall symmetrical. Respirations even and  non-labored. Breath sounds clear to auscultation bilaterally.  CARDIAC: S1, S2 present, regular rate and rhythm. Peripheral pulses 2+ bilaterally.  MSK: Muscle tone and strength appropriate for age. Joints w/o tenderness, redness, or swelling.  EXTREMITIES: Without clubbing, cyanosis, or edema.  NEUROLOGIC: No motor or sensory deficits. Steady, even gait.  PSYCH/MENTAL STATUS: Alert, oriented x 3. Cooperative, appropriate mood and affect.   Health Maintenance Due  Topic Date Due   HIV Screening  Never done   Hepatitis C Screening  Never done   DTaP/Tdap/Td (1 - Tdap) Never done    No results found for any visits on 09/26/23.  Assessment & Plan:   No orders of the defined types were placed in this encounter.  No orders of the defined types were placed in this encounter.   No follow-ups on file.   Gavin Kast, FNP

## 2023-10-06 DIAGNOSIS — F411 Generalized anxiety disorder: Secondary | ICD-10-CM | POA: Diagnosis not present

## 2023-10-06 DIAGNOSIS — F331 Major depressive disorder, recurrent, moderate: Secondary | ICD-10-CM | POA: Diagnosis not present

## 2024-01-03 DIAGNOSIS — F411 Generalized anxiety disorder: Secondary | ICD-10-CM | POA: Diagnosis not present

## 2024-01-03 DIAGNOSIS — F331 Major depressive disorder, recurrent, moderate: Secondary | ICD-10-CM | POA: Diagnosis not present

## 2024-01-29 DIAGNOSIS — M25562 Pain in left knee: Secondary | ICD-10-CM | POA: Diagnosis not present

## 2024-03-15 DIAGNOSIS — F411 Generalized anxiety disorder: Secondary | ICD-10-CM | POA: Diagnosis not present

## 2024-03-15 DIAGNOSIS — F331 Major depressive disorder, recurrent, moderate: Secondary | ICD-10-CM | POA: Diagnosis not present

## 2024-04-05 DIAGNOSIS — M25562 Pain in left knee: Secondary | ICD-10-CM | POA: Diagnosis not present
